# Patient Record
Sex: Male | Born: 1950 | Race: White | Hispanic: No | Smoking: Current every day smoker
Health system: Southern US, Community
[De-identification: ages and names within clinical notes are randomized; demographics above are authoritative.]

## PROBLEM LIST (undated history)

## (undated) DIAGNOSIS — K219 Gastro-esophageal reflux disease without esophagitis: Secondary | ICD-10-CM

## (undated) DIAGNOSIS — K5792 Diverticulitis of intestine, part unspecified, without perforation or abscess without bleeding: Secondary | ICD-10-CM

## (undated) DIAGNOSIS — F419 Anxiety disorder, unspecified: Secondary | ICD-10-CM

## (undated) DIAGNOSIS — F5104 Psychophysiologic insomnia: Secondary | ICD-10-CM

## (undated) DIAGNOSIS — M1990 Unspecified osteoarthritis, unspecified site: Secondary | ICD-10-CM

## (undated) DIAGNOSIS — K759 Inflammatory liver disease, unspecified: Secondary | ICD-10-CM

## (undated) DIAGNOSIS — J449 Chronic obstructive pulmonary disease, unspecified: Secondary | ICD-10-CM

## (undated) DIAGNOSIS — K7460 Unspecified cirrhosis of liver: Secondary | ICD-10-CM

## (undated) DIAGNOSIS — I10 Essential (primary) hypertension: Secondary | ICD-10-CM

## (undated) HISTORY — PX: OTHER SURGICAL HISTORY: SHX169

## (undated) HISTORY — PX: COLON SURGERY: SHX602

## (undated) HISTORY — PX: PARTIAL COLECTOMY: SHX5273

---

## 2015-03-05 DIAGNOSIS — M4306 Spondylolysis, lumbar region: Secondary | ICD-10-CM | POA: Insufficient documentation

## 2015-04-17 DIAGNOSIS — M4726 Other spondylosis with radiculopathy, lumbar region: Secondary | ICD-10-CM | POA: Insufficient documentation

## 2015-06-23 ENCOUNTER — Other Ambulatory Visit (HOSPITAL_COMMUNITY): Payer: Self-pay | Admitting: Nurse Practitioner

## 2015-06-23 DIAGNOSIS — B182 Chronic viral hepatitis C: Secondary | ICD-10-CM

## 2015-07-01 ENCOUNTER — Ambulatory Visit (HOSPITAL_COMMUNITY): Payer: Self-pay

## 2015-07-14 ENCOUNTER — Other Ambulatory Visit: Payer: Self-pay | Admitting: Nurse Practitioner

## 2015-07-14 DIAGNOSIS — K7469 Other cirrhosis of liver: Secondary | ICD-10-CM

## 2015-07-17 ENCOUNTER — Ambulatory Visit (HOSPITAL_COMMUNITY)
Admission: RE | Admit: 2015-07-17 | Discharge: 2015-07-17 | Disposition: A | Discharge status: Home / Self Care | Payer: Medicare HMO | Source: Ambulatory Visit | Attending: Nurse Practitioner | Admitting: Nurse Practitioner

## 2015-07-17 DIAGNOSIS — B182 Chronic viral hepatitis C: Secondary | ICD-10-CM | POA: Diagnosis present

## 2015-07-26 ENCOUNTER — Ambulatory Visit
Admission: RE | Admit: 2015-07-26 | Discharge: 2015-07-26 | Disposition: A | Discharge status: Home / Self Care | Payer: Medicare HMO | Source: Ambulatory Visit | Attending: Nurse Practitioner | Admitting: Nurse Practitioner

## 2015-07-26 DIAGNOSIS — K7469 Other cirrhosis of liver: Secondary | ICD-10-CM

## 2015-07-26 MED ORDER — GADOXETATE DISODIUM 0.25 MMOL/ML IV SOLN
8.0000 mL | Freq: Once | INTRAVENOUS | Status: AC | PRN
  Administered 2015-07-26: 8 mL via INTRAVENOUS

## 2015-11-26 ENCOUNTER — Other Ambulatory Visit: Payer: Self-pay | Admitting: Nurse Practitioner

## 2015-11-26 DIAGNOSIS — K7460 Unspecified cirrhosis of liver: Secondary | ICD-10-CM

## 2015-12-05 ENCOUNTER — Ambulatory Visit
Admission: RE | Admit: 2015-12-05 | Discharge: 2015-12-05 | Disposition: A | Discharge status: Home / Self Care | Payer: Medicare HMO | Source: Ambulatory Visit | Attending: Nurse Practitioner | Admitting: Nurse Practitioner

## 2015-12-05 ENCOUNTER — Other Ambulatory Visit: Payer: Medicare HMO

## 2015-12-05 DIAGNOSIS — K7460 Unspecified cirrhosis of liver: Secondary | ICD-10-CM

## 2015-12-05 MED ORDER — GADOXETATE DISODIUM 0.25 MMOL/ML IV SOLN
9.0000 mL | Freq: Once | INTRAVENOUS | Status: AC | PRN
  Administered 2015-12-05: 9 mL via INTRAVENOUS

## 2016-01-14 ENCOUNTER — Other Ambulatory Visit: Payer: Self-pay | Admitting: Neurological Surgery

## 2016-02-06 NOTE — Pre-Procedure Instructions (Signed)
    Kenneth Obrien  02/06/2016     No Pharmacies Listed   Your procedure is scheduled on Monday, May 1st        [facility] Admitting at 11:00 Am             (Posted surgery time 2:00 - 6:42 pm)   Call this number if you have problems the morning of surgery:  [phone]  (we are NOT open on the weekends)   Remember:  Do not eat food or drink liquids after midnight Sunday.  Take these medicines the morning of surgery with A SIP OF WATER : Xanax   Do not wear jewelry - no rings or watches.  Do not wear lotions or colognes.  You may NOT wear deodorant the day of surgery.              Men may shave face and neck.  Do not bring valuables to the hospital.  [facility] is not responsible for any belongings or valuables.  Contacts, dentures or bridgework may not be worn into surgery.  Leave your suitcase in the car.  After surgery it may be brought to your room. For patients admitted to the hospital, discharge time will be determined by your treatment team.  Name and phone number of your driver:     Please read over the following fact sheets that you were given. Pain Booklet, Coughing and Deep Breathing, Blood Transfusion Information, MRSA Information and Surgical Site Infection Prevention

## 2016-02-09 ENCOUNTER — Encounter (HOSPITAL_COMMUNITY): Payer: Self-pay

## 2016-02-09 ENCOUNTER — Encounter (HOSPITAL_COMMUNITY)
Admission: RE | Admit: 2016-02-09 | Discharge: 2016-02-09 | Disposition: A | Discharge status: Home / Self Care | Payer: Medicare HMO | Source: Ambulatory Visit | Attending: Neurological Surgery | Admitting: Neurological Surgery

## 2016-02-09 DIAGNOSIS — F17200 Nicotine dependence, unspecified, uncomplicated: Secondary | ICD-10-CM | POA: Diagnosis not present

## 2016-02-09 DIAGNOSIS — K219 Gastro-esophageal reflux disease without esophagitis: Secondary | ICD-10-CM | POA: Insufficient documentation

## 2016-02-09 DIAGNOSIS — Z01818 Encounter for other preprocedural examination: Secondary | ICD-10-CM | POA: Diagnosis present

## 2016-02-09 DIAGNOSIS — Z0183 Encounter for blood typing: Secondary | ICD-10-CM | POA: Insufficient documentation

## 2016-02-09 DIAGNOSIS — I10 Essential (primary) hypertension: Secondary | ICD-10-CM | POA: Insufficient documentation

## 2016-02-09 DIAGNOSIS — Z01812 Encounter for preprocedural laboratory examination: Secondary | ICD-10-CM | POA: Insufficient documentation

## 2016-02-09 DIAGNOSIS — Z79899 Other long term (current) drug therapy: Secondary | ICD-10-CM | POA: Diagnosis not present

## 2016-02-09 DIAGNOSIS — M4806 Spinal stenosis, lumbar region: Secondary | ICD-10-CM | POA: Diagnosis not present

## 2016-02-09 DIAGNOSIS — J449 Chronic obstructive pulmonary disease, unspecified: Secondary | ICD-10-CM | POA: Insufficient documentation

## 2016-02-09 DIAGNOSIS — B1920 Unspecified viral hepatitis C without hepatic coma: Secondary | ICD-10-CM | POA: Insufficient documentation

## 2016-02-09 DIAGNOSIS — K7460 Unspecified cirrhosis of liver: Secondary | ICD-10-CM | POA: Insufficient documentation

## 2016-02-09 HISTORY — DX: Gastro-esophageal reflux disease without esophagitis: K21.9

## 2016-02-09 HISTORY — DX: Diverticulitis of intestine, part unspecified, without perforation or abscess without bleeding: K57.92

## 2016-02-09 HISTORY — DX: Anxiety disorder, unspecified: F41.9

## 2016-02-09 HISTORY — DX: Unspecified cirrhosis of liver: K74.60

## 2016-02-09 HISTORY — DX: Unspecified osteoarthritis, unspecified site: M19.90

## 2016-02-09 HISTORY — DX: Inflammatory liver disease, unspecified: K75.9

## 2016-02-09 HISTORY — DX: Psychophysiologic insomnia: F51.04

## 2016-02-09 HISTORY — DX: Chronic obstructive pulmonary disease, unspecified: J44.9

## 2016-02-09 HISTORY — DX: Essential (primary) hypertension: I10

## 2016-02-09 LAB — COMPREHENSIVE METABOLIC PANEL
ALBUMIN: 2.9 g/dL — AB (ref 3.5–5.0)
ALT: 16 U/L — ABNORMAL LOW (ref 17–63)
ANION GAP: 7 (ref 5–15)
AST: 36 U/L (ref 15–41)
Alkaline Phosphatase: 132 U/L — ABNORMAL HIGH (ref 38–126)
BUN: <5 mg/dL — ABNORMAL LOW (ref 6–20)
CO2: 24 mmol/L (ref 22–32)
Calcium: 8.6 mg/dL — ABNORMAL LOW (ref 8.9–10.3)
Chloride: 109 mmol/L (ref 101–111)
Creatinine, Ser: 0.84 mg/dL (ref 0.61–1.24)
GFR calc Af Amer: >60 mL/min (ref >60)
GFR calc non Af Amer: >60 mL/min (ref >60)
GLUCOSE: 88 mg/dL (ref 65–99)
POTASSIUM: 4.3 mmol/L (ref 3.5–5.1)
SODIUM: 140 mmol/L (ref 135–145)
Total Bilirubin: 3.2 mg/dL — ABNORMAL HIGH (ref 0.3–1.2)
Total Protein: 6.9 g/dL (ref 6.5–8.1)

## 2016-02-09 LAB — ABO/RH: ABO/RH(D): B POS

## 2016-02-09 LAB — CBC
HEMATOCRIT: 36.1 % — AB (ref 39.0–52.0)
HEMOGLOBIN: 12.7 g/dL — AB (ref 13.0–17.0)
MCH: 35.5 pg — AB (ref 26.0–34.0)
MCHC: 35.2 g/dL (ref 30.0–36.0)
MCV: 100.8 fL — AB (ref 78.0–100.0)
PLATELETS: 77 10*3/uL — AB (ref 150–400)
RBC: 3.58 MIL/uL — AB (ref 4.22–5.81)
RDW: 14.8 % (ref 11.5–15.5)
WBC: 4.8 10*3/uL (ref 4.0–10.5)

## 2016-02-09 LAB — SURGICAL PCR SCREEN
MRSA, PCR: NEGATIVE
Staphylococcus aureus: NEGATIVE

## 2016-02-09 NOTE — Progress Notes (Signed)
   02/09/16 1141  OBSTRUCTIVE SLEEP APNEA  Have you ever been diagnosed with sleep apnea through a sleep study? No  Do you snore loudly (loud enough to be heard through closed doors)?  0  Do you often feel tired, fatigued, or sleepy during the daytime (such as falling asleep during driving or talking to someone)? 1 (has chronic insomnia)  Has anyone observed you stop breathing during your sleep? 0  Do you have, or are you being treated for high blood pressure? 1  BMI more than 35 kg/m2? 0  Age > 50 (1-yes) 1  Neck circumference greater than:Male 16 inches or larger, Male 17inches or larger? 0  Male Gender (Yes=1) 1  Obstructive Sleep Apnea Score 4  Score 5 or greater  Results sent to PCP

## 2016-02-09 NOTE — Progress Notes (Addendum)
PC[place] [facility]  [phone] Denies any cardiac issues, and has never been to see a cardiologist [facility] Ros[facility]Elsner's office (he's in surgery), regarding Mr. Zachery DauerBarnes abnl labs, esp platelet ct of 77,000.  Had received treatment for Hepatitis C.  Called his PCP for any old CMP & CBC results.  Also called [facility] [phone] for any additional labs he had drawn there during his Hep C treatments.

## 2016-02-10 ENCOUNTER — Encounter (HOSPITAL_COMMUNITY): Payer: Self-pay

## 2016-02-10 NOTE — Progress Notes (Signed)
Anesthesia Chart Review:  Pt is a 65 year old male scheduled for L4-5, L5-S1 PLIF on 02/23/2016 with Dr. Danielle DessElsner.   PCP is Dr. Donnel Sax[place]ran Haque [place]. Hepatologist is Dr. Loura HaltPhillipe Zamor at [facility]; recently finished treatment for hepatitis C, ongoing care for cirrhosis.   PMH includes:  HTN, COPD, hepatitis C, liver cirrhosis, GERD. Current smoker. BMI 28  Medications include: carvedilol  Preoperative labs reviewed.  Platelets 77K. Notified Jessica in Dr. Verlee RossettiElsner's office of lab results. Will get CBC, PT, PTT DOS.   EKG 02/09/16: Sinus bradycardia (51 bpm)  Pt considered to be at high risk for mortality perioperatively due to decompensated cirrhosis per Dr. Keane PoliceZamor's notes (mortality risk at 30 days is 37.1%). Notes also indicate pt is aware of risk and that he is willing to accept any risk if there is a possibility of improving his back pain. (See notes on paper chart).   Reviewed case with Dr. Hart RochesterHollis.   If labs acceptable DOS, I anticipate pt can proceed as scheduled.   Rica Mastngela Haydon Kalmar, FNP-BC [facility] Phone: [phone] 02/10/2016 3:52 PM

## 2016-02-22 MED ORDER — CEFAZOLIN SODIUM-DEXTROSE 2-4 GM/100ML-% IV SOLN
2.0000 g | INTRAVENOUS | Status: AC
  Administered 2016-02-23 (×2): 2 g via INTRAVENOUS
  Filled 2016-02-22: qty 100

## 2016-02-23 ENCOUNTER — Inpatient Hospital Stay (HOSPITAL_COMMUNITY): Payer: Medicare HMO

## 2016-02-23 ENCOUNTER — Inpatient Hospital Stay (HOSPITAL_COMMUNITY)
Admission: RE | Admit: 2016-02-23 | Discharge: 2016-03-01 | DRG: 460 | Disposition: A | Discharge status: Skilled Nursing Facility | Payer: Medicare HMO | Source: Ambulatory Visit | Attending: Neurological Surgery | Admitting: Neurological Surgery

## 2016-02-23 ENCOUNTER — Encounter (HOSPITAL_COMMUNITY): Payer: Self-pay | Admitting: *Deleted

## 2016-02-23 ENCOUNTER — Other Ambulatory Visit: Payer: Self-pay

## 2016-02-23 ENCOUNTER — Inpatient Hospital Stay (HOSPITAL_COMMUNITY): Payer: Medicare HMO | Admitting: Anesthesiology

## 2016-02-23 ENCOUNTER — Encounter (HOSPITAL_COMMUNITY)
Admission: RE | Disposition: A | Discharge status: Skilled Nursing Facility | Payer: Self-pay | Source: Ambulatory Visit | Attending: Neurological Surgery

## 2016-02-23 ENCOUNTER — Inpatient Hospital Stay (HOSPITAL_COMMUNITY): Payer: Medicare HMO | Admitting: Vascular Surgery

## 2016-02-23 DIAGNOSIS — I9581 Postprocedural hypotension: Secondary | ICD-10-CM | POA: Diagnosis not present

## 2016-02-23 DIAGNOSIS — K7460 Unspecified cirrhosis of liver: Secondary | ICD-10-CM | POA: Diagnosis present

## 2016-02-23 DIAGNOSIS — M4726 Other spondylosis with radiculopathy, lumbar region: Secondary | ICD-10-CM | POA: Diagnosis present

## 2016-02-23 DIAGNOSIS — M549 Dorsalgia, unspecified: Secondary | ICD-10-CM

## 2016-02-23 DIAGNOSIS — J449 Chronic obstructive pulmonary disease, unspecified: Secondary | ICD-10-CM | POA: Diagnosis present

## 2016-02-23 DIAGNOSIS — F17210 Nicotine dependence, cigarettes, uncomplicated: Secondary | ICD-10-CM | POA: Diagnosis present

## 2016-02-23 DIAGNOSIS — S3992XD Unspecified injury of lower back, subsequent encounter: Secondary | ICD-10-CM | POA: Diagnosis not present

## 2016-02-23 DIAGNOSIS — D62 Acute posthemorrhagic anemia: Secondary | ICD-10-CM | POA: Diagnosis not present

## 2016-02-23 DIAGNOSIS — K5792 Diverticulitis of intestine, part unspecified, without perforation or abscess without bleeding: Secondary | ICD-10-CM | POA: Diagnosis present

## 2016-02-23 DIAGNOSIS — M4316 Spondylolisthesis, lumbar region: Secondary | ICD-10-CM | POA: Diagnosis present

## 2016-02-23 DIAGNOSIS — M48062 Spinal stenosis, lumbar region with neurogenic claudication: Secondary | ICD-10-CM | POA: Diagnosis present

## 2016-02-23 DIAGNOSIS — F419 Anxiety disorder, unspecified: Secondary | ICD-10-CM | POA: Diagnosis present

## 2016-02-23 DIAGNOSIS — D689 Coagulation defect, unspecified: Secondary | ICD-10-CM | POA: Diagnosis present

## 2016-02-23 DIAGNOSIS — M4806 Spinal stenosis, lumbar region: Secondary | ICD-10-CM | POA: Diagnosis not present

## 2016-02-23 DIAGNOSIS — R739 Hyperglycemia, unspecified: Secondary | ICD-10-CM | POA: Diagnosis present

## 2016-02-23 DIAGNOSIS — D696 Thrombocytopenia, unspecified: Secondary | ICD-10-CM | POA: Insufficient documentation

## 2016-02-23 DIAGNOSIS — E861 Hypovolemia: Secondary | ICD-10-CM | POA: Diagnosis not present

## 2016-02-23 DIAGNOSIS — E872 Acidosis: Secondary | ICD-10-CM | POA: Diagnosis present

## 2016-02-23 DIAGNOSIS — G629 Polyneuropathy, unspecified: Secondary | ICD-10-CM | POA: Insufficient documentation

## 2016-02-23 DIAGNOSIS — S3992XA Unspecified injury of lower back, initial encounter: Secondary | ICD-10-CM

## 2016-02-23 DIAGNOSIS — F5104 Psychophysiologic insomnia: Secondary | ICD-10-CM | POA: Diagnosis present

## 2016-02-23 DIAGNOSIS — N401 Enlarged prostate with lower urinary tract symptoms: Secondary | ICD-10-CM | POA: Diagnosis not present

## 2016-02-23 DIAGNOSIS — K7290 Hepatic failure, unspecified without coma: Secondary | ICD-10-CM | POA: Diagnosis present

## 2016-02-23 DIAGNOSIS — I10 Essential (primary) hypertension: Secondary | ICD-10-CM | POA: Insufficient documentation

## 2016-02-23 DIAGNOSIS — R338 Other retention of urine: Secondary | ICD-10-CM | POA: Diagnosis not present

## 2016-02-23 DIAGNOSIS — K219 Gastro-esophageal reflux disease without esophagitis: Secondary | ICD-10-CM | POA: Diagnosis present

## 2016-02-23 DIAGNOSIS — G4733 Obstructive sleep apnea (adult) (pediatric): Secondary | ICD-10-CM | POA: Diagnosis present

## 2016-02-23 DIAGNOSIS — B182 Chronic viral hepatitis C: Secondary | ICD-10-CM | POA: Diagnosis present

## 2016-02-23 DIAGNOSIS — M79606 Pain in leg, unspecified: Secondary | ICD-10-CM | POA: Diagnosis present

## 2016-02-23 DIAGNOSIS — R0689 Other abnormalities of breathing: Secondary | ICD-10-CM | POA: Insufficient documentation

## 2016-02-23 DIAGNOSIS — M5442 Lumbago with sciatica, left side: Secondary | ICD-10-CM | POA: Diagnosis not present

## 2016-02-23 DIAGNOSIS — D72829 Elevated white blood cell count, unspecified: Secondary | ICD-10-CM | POA: Insufficient documentation

## 2016-02-23 LAB — BASIC METABOLIC PANEL
Anion gap: 6 (ref 5–15)
BUN: 6 mg/dL (ref 6–20)
CHLORIDE: 111 mmol/L (ref 101–111)
CO2: 21 mmol/L — ABNORMAL LOW (ref 22–32)
Calcium: 7.7 mg/dL — ABNORMAL LOW (ref 8.9–10.3)
Creatinine, Ser: 1.03 mg/dL (ref 0.61–1.24)
GFR calc Af Amer: >60 mL/min (ref >60)
GFR calc non Af Amer: >60 mL/min (ref >60)
Glucose, Bld: 156 mg/dL — ABNORMAL HIGH (ref 65–99)
POTASSIUM: 4.7 mmol/L (ref 3.5–5.1)
SODIUM: 138 mmol/L (ref 135–145)

## 2016-02-23 LAB — CBC
HCT: 25.3 % — ABNORMAL LOW (ref 39.0–52.0)
HEMATOCRIT: 31.4 % — AB (ref 39.0–52.0)
HEMATOCRIT: 25.6 % — AB (ref 39.0–52.0)
HEMOGLOBIN: 8.6 g/dL — AB (ref 13.0–17.0)
HEMOGLOBIN: 8.9 g/dL — AB (ref 13.0–17.0)
Hemoglobin: 11 g/dL — ABNORMAL LOW (ref 13.0–17.0)
MCH: 35.3 pg — AB (ref 26.0–34.0)
MCH: 33.6 pg (ref 26.0–34.0)
MCH: 33.3 pg (ref 26.0–34.0)
MCHC: 35 g/dL (ref 30.0–36.0)
MCHC: 34 g/dL (ref 30.0–36.0)
MCHC: 34.8 g/dL (ref 30.0–36.0)
MCV: 100.6 fL — ABNORMAL HIGH (ref 78.0–100.0)
MCV: 98.8 fL (ref 78.0–100.0)
MCV: 95.9 fL (ref 78.0–100.0)
PLATELETS: 60 10*3/uL — AB (ref 150–400)
Platelets: 63 10*3/uL — ABNORMAL LOW (ref 150–400)
Platelets: 63 10*3/uL — ABNORMAL LOW (ref 150–400)
RBC: 3.12 MIL/uL — ABNORMAL LOW (ref 4.22–5.81)
RBC: 2.56 MIL/uL — AB (ref 4.22–5.81)
RBC: 2.67 MIL/uL — AB (ref 4.22–5.81)
RDW: 15 % (ref 11.5–15.5)
RDW: 14.8 % (ref 11.5–15.5)
RDW: 16.4 % — ABNORMAL HIGH (ref 11.5–15.5)
WBC: 4.7 10*3/uL (ref 4.0–10.5)
WBC: 8.8 10*3/uL (ref 4.0–10.5)
WBC: 13.9 10*3/uL — ABNORMAL HIGH (ref 4.0–10.5)

## 2016-02-23 LAB — PROTIME-INR
INR: 1.77 — AB (ref 0.00–1.49)
INR: 2.42 — AB (ref 0.00–1.49)
PROTHROMBIN TIME: 20.6 s — AB (ref 11.6–15.2)
Prothrombin Time: 26 seconds — ABNORMAL HIGH (ref 11.6–15.2)

## 2016-02-23 LAB — APTT
APTT: 50 s — AB (ref 24–37)
aPTT: 42 seconds — ABNORMAL HIGH (ref 24–37)

## 2016-02-23 LAB — TROPONIN I: Troponin I: <0.03 ng/mL (ref <0.031)

## 2016-02-23 SURGERY — POSTERIOR LUMBAR FUSION 2 LEVEL
Anesthesia: General | Site: Back

## 2016-02-23 MED ORDER — ACETAMINOPHEN 650 MG RE SUPP: 650.0000 mg | RECTAL | Status: DC | PRN

## 2016-02-23 MED ORDER — THROMBIN 20000 UNITS EX SOLR
CUTANEOUS | Status: DC | PRN
  Administered 2016-02-23: 20 mL via TOPICAL

## 2016-02-23 MED ORDER — ONDANSETRON HCL 4 MG/2ML IJ SOLN
INTRAMUSCULAR | Status: DC | PRN
  Administered 2016-02-23: 4 mg via INTRAVENOUS

## 2016-02-23 MED ORDER — DEXTROSE 5 % IV SOLN: 500.0000 mg | Freq: Four times a day (QID) | INTRAVENOUS | Status: DC | PRN

## 2016-02-23 MED ORDER — PHENYLEPHRINE HCL 10 MG/ML IJ SOLN
INTRAMUSCULAR | Status: DC | PRN
  Administered 2016-02-23 (×6): 80 ug via INTRAVENOUS

## 2016-02-23 MED ORDER — OXYCODONE-ACETAMINOPHEN 5-325 MG PO TABS: 1.0000 | ORAL_TABLET | ORAL | Status: DC | PRN

## 2016-02-23 MED ORDER — DEXTROSE 5 % IV SOLN
10.0000 mg | INTRAVENOUS | Status: DC | PRN
  Administered 2016-02-23: 20 ug/min via INTRAVENOUS

## 2016-02-23 MED ORDER — METOCLOPRAMIDE HCL 5 MG/ML IJ SOLN: 10.0000 mg | Freq: Once | INTRAMUSCULAR | Status: DC | PRN

## 2016-02-23 MED ORDER — THROMBIN 5000 UNITS EX SOLR
OROMUCOSAL | Status: DC | PRN
  Administered 2016-02-23 (×4): 10 mL via TOPICAL
  Administered 2016-02-23: 10:00:00 via TOPICAL

## 2016-02-23 MED ORDER — EPHEDRINE SULFATE 50 MG/ML IJ SOLN
INTRAMUSCULAR | Status: DC | PRN
  Administered 2016-02-23: 5 mg via INTRAVENOUS

## 2016-02-23 MED ORDER — DIPHENHYDRAMINE HCL 50 MG/ML IJ SOLN
INTRAMUSCULAR | Status: AC
  Filled 2016-02-23: qty 1

## 2016-02-23 MED ORDER — MIDAZOLAM HCL 2 MG/2ML IJ SOLN
INTRAMUSCULAR | Status: AC
  Filled 2016-02-23: qty 2

## 2016-02-23 MED ORDER — CARVEDILOL 3.125 MG PO TABS
ORAL_TABLET | ORAL | Status: AC
  Administered 2016-02-23: 07:00:00
  Filled 2016-02-23: qty 1

## 2016-02-23 MED ORDER — LIDOCAINE 2% (20 MG/ML) 5 ML SYRINGE
INTRAMUSCULAR | Status: AC
  Filled 2016-02-23: qty 5

## 2016-02-23 MED ORDER — MIDAZOLAM HCL 5 MG/5ML IJ SOLN
INTRAMUSCULAR | Status: DC | PRN
  Administered 2016-02-23: 2 mg via INTRAVENOUS

## 2016-02-23 MED ORDER — BUPIVACAINE HCL (PF) 0.5 % IJ SOLN
INTRAMUSCULAR | Status: DC | PRN
  Administered 2016-02-23: 5 mL
  Administered 2016-02-23: 20 mL

## 2016-02-23 MED ORDER — ALUM & MAG HYDROXIDE-SIMETH 200-200-20 MG/5ML PO SUSP
30.0000 mL | Freq: Four times a day (QID) | ORAL | Status: DC | PRN
  Administered 2016-02-25: 30 mL via ORAL
  Filled 2016-02-23: qty 30

## 2016-02-23 MED ORDER — GLYCOPYRROLATE 0.2 MG/ML IJ SOLN
INTRAMUSCULAR | Status: DC | PRN
  Administered 2016-02-23: .6 mg via INTRAVENOUS

## 2016-02-23 MED ORDER — ALBUMIN HUMAN 5 % IV SOLN
INTRAVENOUS | Status: DC | PRN
Start: 2016-02-23 — End: 2016-02-23
  Administered 2016-02-23: 10:00:00 via INTRAVENOUS

## 2016-02-23 MED ORDER — SODIUM CHLORIDE 0.9 % IV SOLN: Freq: Once | INTRAVENOUS | Status: DC

## 2016-02-23 MED ORDER — FENTANYL CITRATE (PF) 100 MCG/2ML IJ SOLN
INTRAMUSCULAR | Status: AC
  Filled 2016-02-23: qty 2

## 2016-02-23 MED ORDER — VECURONIUM BROMIDE 10 MG IV SOLR
INTRAVENOUS | Status: DC | PRN
  Administered 2016-02-23: 4 mg via INTRAVENOUS
  Administered 2016-02-23 (×2): 3 mg via INTRAVENOUS

## 2016-02-23 MED ORDER — DEXAMETHASONE SODIUM PHOSPHATE 10 MG/ML IJ SOLN
INTRAMUSCULAR | Status: AC
  Filled 2016-02-23: qty 1

## 2016-02-23 MED ORDER — SODIUM CHLORIDE 0.9 % IV SOLN
INTRAVENOUS | Status: DC
  Administered 2016-02-23: 16:00:00 via INTRAVENOUS

## 2016-02-23 MED ORDER — METHOCARBAMOL 500 MG PO TABS
500.0000 mg | ORAL_TABLET | Freq: Four times a day (QID) | ORAL | Status: DC | PRN
  Administered 2016-02-23 – 2016-02-29 (×4): 500 mg via ORAL
  Filled 2016-02-23 (×6): qty 1

## 2016-02-23 MED ORDER — LIDOCAINE-EPINEPHRINE 1 %-1:100000 IJ SOLN
INTRAMUSCULAR | Status: DC | PRN
  Administered 2016-02-23: 5 mL

## 2016-02-23 MED ORDER — FENTANYL CITRATE (PF) 100 MCG/2ML IJ SOLN
INTRAMUSCULAR | Status: DC | PRN
  Administered 2016-02-23: 250 ug via INTRAVENOUS
  Administered 2016-02-23: 100 ug via INTRAVENOUS
  Administered 2016-02-23: 50 ug via INTRAVENOUS
  Administered 2016-02-23: 100 ug via INTRAVENOUS
  Administered 2016-02-23: 50 ug via INTRAVENOUS
  Administered 2016-02-23 (×2): 100 ug via INTRAVENOUS

## 2016-02-23 MED ORDER — PROPOFOL 10 MG/ML IV BOLUS
INTRAVENOUS | Status: DC | PRN
  Administered 2016-02-23: 200 mg via INTRAVENOUS

## 2016-02-23 MED ORDER — FENTANYL CITRATE (PF) 250 MCG/5ML IJ SOLN
INTRAMUSCULAR | Status: AC
  Filled 2016-02-23: qty 5

## 2016-02-23 MED ORDER — ROCURONIUM BROMIDE 50 MG/5ML IV SOLN
INTRAVENOUS | Status: AC
  Filled 2016-02-23: qty 1

## 2016-02-23 MED ORDER — LIDOCAINE HCL (CARDIAC) 20 MG/ML IV SOLN
INTRAVENOUS | Status: DC | PRN
  Administered 2016-02-23: 100 mg via INTRAVENOUS

## 2016-02-23 MED ORDER — FENTANYL CITRATE (PF) 100 MCG/2ML IJ SOLN
25.0000 ug | INTRAMUSCULAR | Status: DC | PRN
  Administered 2016-02-23 – 2016-02-24 (×2): 75 ug via INTRAVENOUS
  Filled 2016-02-23 (×2): qty 2

## 2016-02-23 MED ORDER — NEOSTIGMINE METHYLSULFATE 10 MG/10ML IV SOLN
INTRAVENOUS | Status: DC | PRN
  Administered 2016-02-23: 2 mg via INTRAVENOUS

## 2016-02-23 MED ORDER — BACITRACIN 50000 UNITS IM SOLR
INTRAMUSCULAR | Status: DC | PRN
  Administered 2016-02-23: 500 mL

## 2016-02-23 MED ORDER — FENTANYL CITRATE (PF) 100 MCG/2ML IJ SOLN
25.0000 ug | INTRAMUSCULAR | Status: DC | PRN
  Administered 2016-02-23 (×2): 25 ug via INTRAVENOUS

## 2016-02-23 MED ORDER — SODIUM CHLORIDE 0.9 % IJ SOLN
INTRAMUSCULAR | Status: AC
  Filled 2016-02-23: qty 20

## 2016-02-23 MED ORDER — MENTHOL 3 MG MT LOZG
1.0000 | LOZENGE | OROMUCOSAL | Status: DC | PRN
  Administered 2016-02-23: 3 mg via ORAL
  Filled 2016-02-23: qty 9

## 2016-02-23 MED ORDER — DEXAMETHASONE SODIUM PHOSPHATE 10 MG/ML IJ SOLN
INTRAMUSCULAR | Status: DC | PRN
  Administered 2016-02-23: 10 mg via INTRAVENOUS

## 2016-02-23 MED ORDER — ZOLPIDEM TARTRATE 5 MG PO TABS: 10.0000 mg | ORAL_TABLET | Freq: Every day | ORAL | Status: DC

## 2016-02-23 MED ORDER — VITAMIN K1 10 MG/ML IJ SOLN
10.0000 mg | Freq: Once | INTRAVENOUS | Status: AC
  Administered 2016-02-23: 10 mg via INTRAVENOUS
  Filled 2016-02-23: qty 1

## 2016-02-23 MED ORDER — SODIUM CHLORIDE 0.9 % IR SOLN
Status: DC | PRN
  Administered 2016-02-23: 1000 mL

## 2016-02-23 MED ORDER — SODIUM CHLORIDE 0.9 % IV SOLN: 250.0000 mL | INTRAVENOUS | Status: DC

## 2016-02-23 MED ORDER — MORPHINE SULFATE (PF) 2 MG/ML IV SOLN
1.0000 mg | INTRAVENOUS | Status: DC | PRN
  Administered 2016-02-23 (×2): 2 mg via INTRAVENOUS
  Filled 2016-02-23 (×2): qty 1

## 2016-02-23 MED ORDER — CARVEDILOL 3.125 MG PO TABS: 1.5625 mg | ORAL_TABLET | Freq: Two times a day (BID) | ORAL | Status: DC

## 2016-02-23 MED ORDER — ARTIFICIAL TEARS OP OINT
TOPICAL_OINTMENT | OPHTHALMIC | Status: DC | PRN
  Administered 2016-02-23: 1 via OPHTHALMIC

## 2016-02-23 MED ORDER — SODIUM CHLORIDE 0.9 % IV BOLUS (SEPSIS)
1000.0000 mL | Freq: Once | INTRAVENOUS | Status: AC
  Administered 2016-02-23: 1000 mL via INTRAVENOUS

## 2016-02-23 MED ORDER — CARVEDILOL 3.125 MG PO TABS
3.1250 mg | ORAL_TABLET | Freq: Once | ORAL | Status: DC
  Filled 2016-02-23: qty 1

## 2016-02-23 MED ORDER — MEPERIDINE HCL 25 MG/ML IJ SOLN: 6.2500 mg | INTRAMUSCULAR | Status: DC | PRN

## 2016-02-23 MED ORDER — SODIUM CHLORIDE 0.9% FLUSH: 3.0000 mL | INTRAVENOUS | Status: DC | PRN

## 2016-02-23 MED ORDER — FENTANYL CITRATE (PF) 100 MCG/2ML IJ SOLN
25.0000 ug | INTRAMUSCULAR | Status: DC | PRN
  Administered 2016-02-23 (×4): 25 ug via INTRAVENOUS
  Filled 2016-02-23 (×4): qty 2

## 2016-02-23 MED ORDER — LACTATED RINGERS IV SOLN
INTRAVENOUS | Status: DC
Start: 2016-02-23 — End: 2016-02-23

## 2016-02-23 MED ORDER — CEFAZOLIN SODIUM 1-5 GM-% IV SOLN
1.0000 g | Freq: Three times a day (TID) | INTRAVENOUS | Status: AC
  Administered 2016-02-23 – 2016-02-24 (×2): 1 g via INTRAVENOUS
  Filled 2016-02-23 (×3): qty 50

## 2016-02-23 MED ORDER — SODIUM CHLORIDE 0.9% FLUSH
3.0000 mL | Freq: Two times a day (BID) | INTRAVENOUS | Status: DC
  Administered 2016-02-23 – 2016-02-26 (×6): 3 mL via INTRAVENOUS
  Administered 2016-02-26: 6 mL via INTRAVENOUS
  Administered 2016-02-27 – 2016-02-29 (×6): 3 mL via INTRAVENOUS

## 2016-02-23 MED ORDER — POLYETHYLENE GLYCOL 3350 17 G PO PACK: 17.0000 g | PACK | Freq: Every day | ORAL | Status: DC | PRN

## 2016-02-23 MED ORDER — SENNA 8.6 MG PO TABS
1.0000 | ORAL_TABLET | Freq: Two times a day (BID) | ORAL | Status: DC
  Administered 2016-02-24 – 2016-03-01 (×10): 8.6 mg via ORAL
  Filled 2016-02-23 (×12): qty 1

## 2016-02-23 MED ORDER — OXYCODONE HCL 5 MG PO TABS: 20.0000 mg | ORAL_TABLET | Freq: Three times a day (TID) | ORAL | Status: DC | PRN

## 2016-02-23 MED ORDER — DIPHENHYDRAMINE HCL 50 MG/ML IJ SOLN
INTRAMUSCULAR | Status: DC | PRN
  Administered 2016-02-23: 25 mg via INTRAVENOUS

## 2016-02-23 MED ORDER — ALPRAZOLAM 0.5 MG PO TABS
0.5000 mg | ORAL_TABLET | Freq: Every day | ORAL | Status: DC
  Administered 2016-02-23: 0.5 mg via ORAL
  Filled 2016-02-23: qty 1

## 2016-02-23 MED ORDER — MAGNESIUM CITRATE PO SOLN
1.0000 | Freq: Once | ORAL | Status: AC | PRN
  Administered 2016-02-28: 1 via ORAL
  Filled 2016-02-23: qty 296

## 2016-02-23 MED ORDER — ROCURONIUM BROMIDE 100 MG/10ML IV SOLN
INTRAVENOUS | Status: DC | PRN
  Administered 2016-02-23: 50 mg via INTRAVENOUS

## 2016-02-23 MED ORDER — ONDANSETRON HCL 4 MG/2ML IJ SOLN
INTRAMUSCULAR | Status: AC
  Filled 2016-02-23: qty 2

## 2016-02-23 MED ORDER — ONDANSETRON HCL 4 MG/2ML IJ SOLN: 4.0000 mg | INTRAMUSCULAR | Status: DC | PRN

## 2016-02-23 MED ORDER — LACTATED RINGERS IV SOLN
INTRAVENOUS | Status: DC | PRN
  Administered 2016-02-23 (×3): via INTRAVENOUS

## 2016-02-23 MED ORDER — DOCUSATE SODIUM 100 MG PO CAPS
100.0000 mg | ORAL_CAPSULE | Freq: Two times a day (BID) | ORAL | Status: DC
  Administered 2016-02-25 – 2016-03-01 (×10): 100 mg via ORAL
  Filled 2016-02-23 (×12): qty 1

## 2016-02-23 MED ORDER — PHENOL 1.4 % MT LIQD: 1.0000 | OROMUCOSAL | Status: DC | PRN

## 2016-02-23 MED ORDER — SODIUM CHLORIDE 0.9 % IV BOLUS (SEPSIS)
500.0000 mL | Freq: Once | INTRAVENOUS | Status: AC
  Administered 2016-02-23: 500 mL via INTRAVENOUS

## 2016-02-23 MED ORDER — ACETAMINOPHEN 325 MG PO TABS
650.0000 mg | ORAL_TABLET | ORAL | Status: DC | PRN
  Administered 2016-02-27 (×2): 650 mg via ORAL
  Filled 2016-02-23 (×2): qty 2

## 2016-02-23 MED ORDER — LACTATED RINGERS IV SOLN
INTRAVENOUS | Status: DC
  Administered 2016-02-23: 20:00:00 via INTRAVENOUS

## 2016-02-23 MED ORDER — BISACODYL 10 MG RE SUPP: 10.0000 mg | Freq: Every day | RECTAL | Status: DC | PRN

## 2016-02-23 MED ORDER — VECURONIUM BROMIDE 10 MG IV SOLR
INTRAVENOUS | Status: AC
  Filled 2016-02-23: qty 10

## 2016-02-23 SURGICAL SUPPLY — 75 items
BAG DECANTER FOR FLEXI CONT (MISCELLANEOUS) ×3 IMPLANT
BLADE CLIPPER SURG (BLADE) IMPLANT
BONE CANC CHIPS 20CC PCAN1/4 (Bone Implant) ×3 IMPLANT
BUR MATCHSTICK NEURO 3.0 LAGG (BURR) ×3 IMPLANT
CAGE COROENT LRG 9X9X28-8 (Cage) ×6 IMPLANT
CAGE PLIF MAS 9X8X28-4 LUMBAR (Cage) ×6 IMPLANT
CANISTER SUCT 3000ML PPV (MISCELLANEOUS) ×3 IMPLANT
CHIPS CANC BONE 20CC PCAN1/4 (Bone Implant) ×1 IMPLANT
CONT SPEC 4OZ CLIKSEAL STRL BL (MISCELLANEOUS) ×3 IMPLANT
COVER BACK TABLE 60X90IN (DRAPES) ×3 IMPLANT
DECANTER SPIKE VIAL GLASS SM (MISCELLANEOUS) ×3 IMPLANT
DERMABOND ADHESIVE PROPEN (GAUZE/BANDAGES/DRESSINGS) ×2
DERMABOND ADVANCED (GAUZE/BANDAGES/DRESSINGS) ×2
DERMABOND ADVANCED .7 DNX12 (GAUZE/BANDAGES/DRESSINGS) ×1 IMPLANT
DERMABOND ADVANCED .7 DNX6 (GAUZE/BANDAGES/DRESSINGS) ×1 IMPLANT
DEVICE DISSECT PLASMABLAD 3.0S (MISCELLANEOUS) ×1 IMPLANT
DRAPE C-ARM 42X72 X-RAY (DRAPES) ×6 IMPLANT
DRAPE LAPAROTOMY 100X72X124 (DRAPES) ×3 IMPLANT
DRAPE POUCH INSTRU U-SHP 10X18 (DRAPES) ×3 IMPLANT
DRAPE PROXIMA HALF (DRAPES) IMPLANT
DRSG OPSITE 4X5.5 SM (GAUZE/BANDAGES/DRESSINGS) ×3 IMPLANT
DRSG OPSITE POSTOP 4X8 (GAUZE/BANDAGES/DRESSINGS) ×3 IMPLANT
DURAPREP 26ML APPLICATOR (WOUND CARE) ×3 IMPLANT
ELECT REM PT RETURN 9FT ADLT (ELECTROSURGICAL) ×6
ELECTRODE REM PT RTRN 9FT ADLT (ELECTROSURGICAL) ×2 IMPLANT
GAUZE SPONGE 4X4 12PLY STRL (GAUZE/BANDAGES/DRESSINGS) ×3 IMPLANT
GAUZE SPONGE 4X4 16PLY XRAY LF (GAUZE/BANDAGES/DRESSINGS) ×3 IMPLANT
GLOVE BIOGEL PI IND STRL 7.5 (GLOVE) ×1 IMPLANT
GLOVE BIOGEL PI IND STRL 8.5 (GLOVE) ×2 IMPLANT
GLOVE BIOGEL PI INDICATOR 7.5 (GLOVE) ×2
GLOVE BIOGEL PI INDICATOR 8.5 (GLOVE) ×4
GLOVE ECLIPSE 8.5 STRL (GLOVE) ×6 IMPLANT
GLOVE EXAM NITRILE LRG STRL (GLOVE) IMPLANT
GLOVE EXAM NITRILE MD LF STRL (GLOVE) IMPLANT
GLOVE EXAM NITRILE XL STR (GLOVE) IMPLANT
GLOVE EXAM NITRILE XS STR PU (GLOVE) IMPLANT
GLOVE SS BIOGEL STRL SZ 7 (GLOVE) ×1 IMPLANT
GLOVE SUPERSENSE BIOGEL SZ 7 (GLOVE) ×2
GOWN STRL REUS W/ TWL LRG LVL3 (GOWN DISPOSABLE) ×1 IMPLANT
GOWN STRL REUS W/ TWL XL LVL3 (GOWN DISPOSABLE) ×2 IMPLANT
GOWN STRL REUS W/TWL 2XL LVL3 (GOWN DISPOSABLE) ×6 IMPLANT
GOWN STRL REUS W/TWL LRG LVL3 (GOWN DISPOSABLE) ×2
GOWN STRL REUS W/TWL XL LVL3 (GOWN DISPOSABLE) ×4
HEMOSTAT POWDER KIT SURGIFOAM (HEMOSTASIS) ×15 IMPLANT
KIT BASIN OR (CUSTOM PROCEDURE TRAY) ×3 IMPLANT
KIT INFUSE MEDIUM (Orthopedic Implant) ×3 IMPLANT
KIT ROOM TURNOVER OR (KITS) ×3 IMPLANT
MILL MEDIUM DISP (BLADE) ×3 IMPLANT
MODULE POWER NUVASIVE (MISCELLANEOUS) ×1 IMPLANT
NEEDLE HYPO 22GX1.5 SAFETY (NEEDLE) ×3 IMPLANT
NEEDLE SPNL 18GX3.5 QUINCKE PK (NEEDLE) ×3 IMPLANT
NS IRRIG 1000ML POUR BTL (IV SOLUTION) ×3 IMPLANT
PACK LAMINECTOMY NEURO (CUSTOM PROCEDURE TRAY) ×3 IMPLANT
PAD ARMBOARD 7.5X6 YLW CONV (MISCELLANEOUS) ×9 IMPLANT
PATTIES SURGICAL .5 X1 (DISPOSABLE) ×9 IMPLANT
PATTIES SURGICAL 1X1 (DISPOSABLE) ×6 IMPLANT
PLASMABLADE 3.0S (MISCELLANEOUS) ×3
POWER MODULE NUVASIVE (MISCELLANEOUS) ×3
ROD RELINE-O LORDOTIC 5.5X60MM (Rod) ×6 IMPLANT
SCREW LOCK RELINE 5.5 TULIP (Screw) ×18 IMPLANT
SCREW RELINE-O POLY 6.5X45 (Screw) ×12 IMPLANT
SCREW RELINE-O POLY 6.5X50MM (Screw) ×6 IMPLANT
SPONGE LAP 4X18 X RAY DECT (DISPOSABLE) ×3 IMPLANT
SPONGE SURGIFOAM ABS GEL 100 (HEMOSTASIS) ×3 IMPLANT
SUT VIC AB 1 CT1 18XBRD ANBCTR (SUTURE) ×2 IMPLANT
SUT VIC AB 1 CT1 8-18 (SUTURE) ×4
SUT VIC AB 2-0 CP2 18 (SUTURE) ×6 IMPLANT
SUT VIC AB 3-0 SH 8-18 (SUTURE) ×6 IMPLANT
SYR 3ML LL SCALE MARK (SYRINGE) ×12 IMPLANT
SYR 5ML LL (SYRINGE) IMPLANT
TOWEL OR 17X24 6PK STRL BLUE (TOWEL DISPOSABLE) ×3 IMPLANT
TOWEL OR 17X26 10 PK STRL BLUE (TOWEL DISPOSABLE) ×3 IMPLANT
TRAP SPECIMEN MUCOUS 40CC (MISCELLANEOUS) ×3 IMPLANT
TRAY FOLEY W/METER SILVER 14FR (SET/KITS/TRAYS/PACK) ×3 IMPLANT
WATER STERILE IRR 1000ML POUR (IV SOLUTION) ×3 IMPLANT

## 2016-02-23 NOTE — Op Note (Signed)
Date of surgery: 02/23/2016 Preoperative diagnosis: Neurogenic claudication with lumbar spinal stenosis, lumbar radiculopathy  Postoperative diagnosis: Neurogenic claudication with lumbar spinal stenosis, lumbar radiculopathy Procedure: Decompression L4-5 and L5-S1 with laminectomy and decompression the L4 the L5 and the S1 nerve roots more work than required for simple interbody technique. Posterior lumbar interbody arthrodesis with peek spacers local autograft and allograft L4-5 and L5-S1, posterior lateral arthrodesis L4 to the sacrum with local autograft and allograft, pedicle screw fixation L4 to the sacrum.  Surgeon: Barnett Abu First assistant: Cherrie Distance M.D. Anesthesia: Gen. endotracheal Indications: Kenneth Obrien is a 65 year old individual who's had significant back and bilateral lower external knee pain with weakness in the tibialis anterior groups and gastrocs bilaterally. His found to have severe spinal stenosis at L4-5 and L5-S1 with formation of an early degenerative scoliosis in the upper lumbar spine. He was advised regarding surgery to decompress and stabilize this process however because the patient was recovering from severe hepatitis C his surgery was put offfor a period of time.  Procedure: The patient was brought to the operating room supine on a stretcher. After the smooth induction of general endotracheal anesthesia, he was turned prone. The back was prepped with alcohol DuraPrep and draped in a sterile fashion. Midline incision was used in the lower lumbar spine and a subperiosteal dissection was undertaken at L4 and L5 which were ultimately removed identified positively with radiograph. Hemostasis was maintained as well along however the patient was collected pathic with an INR 1.7. We ultimately uncovered the L4-5 facet joint and remove the inferior portion of the facet attached to the L4 lamina. A complete laminectomy was then performed at L5 and the thickened redundant  yellow ligament and these region was taken up which relieved a good portion of the stenosis. The pads of the L4 nerve root superiorly the L5 nerve root inferiorly and the S1 nerve root inferior to that were then sequentially decompressed using a 2 and 3 mm Kerrison punch with care being taken to protect the nerve roots. Hemostasis was obtained meticulously using a bipolar cautery and small pledgets of Gelfoam soaked in thrombin along with surgery phone. Once the decompression was performed on one side the outside was treated same way. The disc spaces were then isolated and then at L4-5 and L5-S1 complete discectomies were performed using a combination of curettes and rongeurs from each side to remove vast quantities of degenerated disc material from within the disc space at L5-S1 there was noted to be a substantial centrally herniated portion of the disc which was removed along with the posterior longitudinal ligament this engendered a fair amount of epidural bleeding which was controlled ultimately by packing with surgery cell and cottonoids. The disc space was then decorticated with each of the endplates being prepared for grafting. The interspace was then distracted so as to open the lateral recesses even further particularly at L5-S1 where there was severe stenosis. Ultimately was felt that 10 mm tall 28 mm long 12 lordotic spacer would fit best into the interspaces at L5-S1 after sizing and checking radiographically. Spacers were prepared and packed with autograft in addition to strips of infuse then a total of 9 mL of autograft was packed into the interspace along with some small strips of infuse and the spacers were placed under radiographic confirmation. At L4-L5 after appropriate sizing is felt that the same 10 mm tall 28 mm long 12 lordotic spacers would fit best and help restore lordosis. These were then packed full  with 6 mL of bone graft into the interspace along with some strips of infuse. The  lateral gutters were then decorticated and packed with a total of 6 mL of autograft and allograft along with strips of infuse between the transverse processes of L4 and the sacral alar.  Next pedicle screws were placed at L4-L5 and S1 each being sounded individually and using fluoroscopic guidance to place them bilaterally and laterally. 6.5 x 45 mm screws were placed at L4 and L5 and 6.5 x 50 mm screws were placed in the sacral with bicortical purchase.  60 mm precontoured rods were then used to connect the screw heads together from L4 to sacrum in a neutral construct. Final radiographic confirmation of the construct was obtained in AP and lateral projections. Care was taken then to make sure the common dural tube the L4 the L5 and S1 nerve roots were well decompressed bilaterally at epidural bleeding was well controlled prior to closure over large Hemovac drain. Lumbar dorsal fascia was closed with #1 Vicryl in interrupted fashion 2-0 Vicryl was used in the subcutaneous anus tissues, 3-0 Vicryl subcuticularly. Dermabond was placed on the skin. Blood loss for the procedure was estimated at 1500 mL and 600 mL of Cell Saver blood was returned to the patient.

## 2016-02-23 NOTE — Progress Notes (Signed)
eLink Physician-Brief Progress Note Patient Name: Kenneth Obrien DOB: Apr 06, 1951 MRN: [id number]   Date of Service  02/23/2016  HPI/Events of Note   Pain - Not relieved with Fentanyl 25 mcg Q 1 hour PRN.   eICU Interventions  Will order: 1. Increase Fentanyl PRN to 25-75 mcg IV Q 1 hour PRN.  2. Bolus with 0.9 NaCl 1 liter IV over 1 hour now.      Intervention Category Intermediate Interventions: Abdominal pain - evaluation and management;Pain - evaluation and management  Sommer,Steven Eugene 02/23/2016, 10:03 PM

## 2016-02-23 NOTE — Anesthesia Postprocedure Evaluation (Signed)
Anesthesia Post Note  Patient: Kenneth MarketStephen C Johns  Procedure(s) Performed: Procedure(s) (LRB): L4-5 L5-S1 Posterior lumbar interbody fusion (N/A)  Patient location during evaluation: PACU Anesthesia Type: General Level of consciousness: awake and alert Pain management: pain level controlled Vital Signs Assessment: post-procedure vital signs reviewed and stable Respiratory status: spontaneous breathing, nonlabored ventilation, respiratory function stable and patient connected to nasal cannula oxygen Cardiovascular status: blood pressure returned to baseline and stable Postop Assessment: no signs of nausea or vomiting Anesthetic complications: no    Last Vitals:  Filed Vitals:   02/23/16 1408 02/23/16 1412  BP:    Pulse:  83  Temp: 36.4 C   Resp: 22 20    Last Pain:  Filed Vitals:   02/23/16 1414  PainSc: 0-No pain                 Phillips Groutarignan, Chrles Selley

## 2016-02-23 NOTE — Consult Note (Signed)
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 He presented to [facility] for elective spine surgery under Dr. Danielle Dess after suffering significant back and lower extremity pain for several months. He was found to have spondylolisthesis with stenosis at L4-5 and L 5-S1 causing severe neuropathy and radiculopathy. He presented 5/1 for L4-5 L5-S1 Posterior lumbar interbody fusion. Surgery was without complication with the exception of about estimated blood loss and returned via cell-saver. INR mildly elevated pre-operatively and platelet count 60. He was transfused platelets prior to surgery. Post-operatively Kenneth Obrien was having some hypotension in ICU and PCCM was consulted for further evaluation.   PAST MEDICAL HISTORY :  He  has a past medical history of Hypertension; Chronic insomnia; COPD (chronic obstructive pulmonary disease) (HCC); Anxiety; GERD (gastroesophageal reflux disease); Arthritis; Hepatitis; Diverticulitis; and Cirrhosis of liver (HCC).  PAST SURGICAL HISTORY: He  has past surgical history that includes Partial colectomy; Colon surgery; and broken left ankle.  Not on File  No current facility-administered medications on file prior to encounter.   No current outpatient prescriptions on file prior to encounter.    FAMILY HISTORY:  His has no family status information on file.   SOCIAL HISTORY: He  reports that he has been smoking Cigarettes.  He has a 20 pack-year smoking history. He does not have any smokeless tobacco history on file. He reports that he does not drink alcohol or use illicit drugs.  REVIEW OF SYSTEMS:    Bolds are positive  Constitutional: weight loss, gain, night sweats, Fevers, chills, fatigue .  HEENT: headaches, Sore throat, sneezing, nasal congestion, post nasal drip, Difficulty swallowing, Tooth/dental problems, visual complaints visual changes, ear ache CV:  chest pain, radiates: ,Orthopnea, PND, swelling in lower extremities, dizziness, palpitations, syncope.  GI  heartburn, indigestion, abdominal pain, nausea, vomiting, diarrhea, change in bowel habits, loss of appetite, bloody stools.  Resp: cough, productive: , hemoptysis, dyspnea, chest pain, pleuritic.  Skin: rash or itching or icterus GU: dysuria, change in color of urine, urgency or frequency. flank pain, hematuria  QM:VHQI pain or swelling. decreased range of motion  Psych: change in mood or affect. depression or anxiety.  Neuro: difficulty with speech, weakness, numbness, ataxia    SUBJECTIVE:   VITAL SIGNS: BP 94/72 mmHg  Pulse 75  Temp(Src) 97 F (36.1 C) (Oral)  Resp 18  Ht  (1.803 m)  Wt 89.812 kg (198 lb)  BMI 27.63 kg/m2  SpO2 100%  HEMODYNAMICS:    VENTILATOR SETTINGS:    INTAKE / OUTPUT:    PHYSICAL EXAMINATION: General:  Thin male mildly agitated due to pain Neuro:  Alert, oriented, non-focal. Movement and sensation intact BLE HEENT:  [place]/AT, PERRL, no appreciable JVD Cardiovascular:  RRR, no MRG Lungs:  Clear bilateral breath sounds Abdomen:  Soft, non-tender, nondistended Musculoskeletal:  No acute deformity, back pain as described above Skin:  Grossly intact with exception of surgical wound, vac in place.  LABS:  BMET  Recent Labs Lab 02/23/16 1643  NA 138  K 4.7  CL 111  CO2 21*  BUN 6  CREATININE 1.03  GLUCOSE 156*    Electrolytes  Recent  Labs Lab 02/23/16 1643  CALCIUM 7.7*    CBC  Recent Labs Lab 02/23/16 0632 02/23/16 1643  WBC 4.7 8.8  HGB 11.0* 8.6*  HCT 31.4* 25.3*  PLT 60* 63*    Coag's  Recent Labs Lab 02/23/16 0632  APTT 42*  INR  1.77*    Sepsis Markers No results for input(s): LATICACIDVEN, PROCALCITON, O2SATVEN in the last 168 hours.  ABG No results for input(s): PHART, PCO2ART, PO2ART in the last 168 hours.  Liver Enzymes No results for input(s): AST, ALT, ALKPHOS, BILITOT, ALBUMIN in the last 168 hours.  Cardiac Enzymes No results for input(s): TROPONINI, PROBNP in the last 168 hours.  Glucose No results for input(s): GLUCAP in the last 168 hours.  Imaging Dg Lumbar Spine 2-3 Views  02/23/2016  CLINICAL DATA:  Operative imaging from lumbar spine fusion. EXAM: LUMBAR SPINE - 2-3 VIEW; DG C-ARM 61-120 MIN COMPARISON:  None. FINDINGS: Pedicle screws and interconnecting rods fuse L4, L5 and S1. Pedicle screws are well positioned and well seated. There are radiolucent disc spacers well centered maintaining disc height the L4-L5 and L5-S1 levels. IMPRESSION: Operative imaging from L4 through S1 posterior lumbar spine fusion. Electronically Signed   By: Amie Portlandavid  Ormond M.D.   On: 02/23/2016 15:31   Dg Lumbar Spine 2-3 Views  02/23/2016  CLINICAL DATA:  L4-5 and L5-S1 PLIF EXAM: LUMBAR SPINE - 2-3 VIEW COMPARISON:  01/17/2016 FINDINGS: Image 1 shows 2 surgical device is 1 projecting above and 1 below the L4 spinous process. Second image shows 2 localization probes, 1 projecting at the L4-5 disc space level and the other at the L5-S1 disc space level. IMPRESSION: Intraoperative localization Electronically Signed   By: Esperanza Heiraymond  Rubner M.D.   On: 02/23/2016 13:37   Dg C-arm 61-120 Min  02/23/2016  CLINICAL DATA:  Operative imaging from lumbar spine fusion. EXAM: LUMBAR SPINE - 2-3 VIEW; DG C-ARM 61-120 MIN COMPARISON:  None. FINDINGS: Pedicle screws and interconnecting rods fuse L4, L5 and S1. Pedicle screws are well positioned and well seated. There are radiolucent disc spacers well centered maintaining disc height the L4-L5 and L5-S1 levels. IMPRESSION: Operative imaging from L4 through S1 posterior lumbar spine fusion.  Electronically Signed   By: Amie Portlandavid  Ormond M.D.   On: 02/23/2016 15:31     STUDIES:    CULTURES:   ANTIBIOTICS: Periop ancef  SIGNIFICANT EVENTS: 5/1 > L4-5 L5-S1 Posterior lumbar interbody fusion, post op hypotnension  LINES/TUBES:   DISCUSSION: 65 year old male with hepatic cirrhosis secondary to Hepatitis C post treamtnet. Prenetend 5/1 for elective spine surgery under Dr. Danielle DessElsner. Had some blood loss during procedure (1500mL, with 750 returned via cellsaver). He was hypotensive post-operatively so PCCM consulted. Hypotenion likely due to hypovolemia/anemia in post op setting. Will give additional bolus, and then pRBC if BP still soft. Trend blood counts and coags.   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  Transfuse for Hgb < 8, (will give one unit pRBC now) Transfuse platelets for count < 50 Repeat PT/INR Type and screen on file Vitamin K 10mg  now SCDs, VTE chemoprophylaxis when OK by primary  INFECTIOUS A:   Surgical ppx  P:   Ancef per primary  ENDOCRINE A:   Hyperglycemia without history of DM  P:   Follow glucose on BMET in AM  NEUROLOGIC A:   Spondylolisthesis L4-5 L5-S1 with stenosis lumbar radiculopathy status post  surgical decompression and fixation 5/1  P:   Management per neurosurgery Hold xanax, ambien while hypotensive   FAMILY  - Updates: patient updated by DF 5/1  - Inter-disciplinary family meet or Palliative Care meeting due by:  5/8   Joneen Roach, AGACNP-BC [place] [facility] Pager [phone] or [phone]  02/23/2016 5:56 PM   STAFF NOTE: Cindi Carbon, MD FACP have personally reviewed patient's available data, including medical history, events of note, physical examination and test results as part of my evaluation. I have discussed with resident/NP and other care providers such as pharmacist, RN and RRT. In addition, I personally evaluated patient and elicited key findings of: Awake, alert, moderate to severe pain, sys pressure improving, no active blood from wound, moves all ext equally well, sensation intact, noted blood loss in setting liver dz, cell saver back about half of losses, concern is hemorraghic shock and liver induced coagulapthy in addition to dilution and consumption peri op, stat cbc, vit K x 1 now IV, repeat coags in am , may need ffp if bleeding from wound noted, assess trop, ecg, would transfuse above hgb 7 if concern is active bleeding, would follw lft in am for shock, cystalloid is okay for now, but if resus needed would prefer products, will follow  Mcarthur Rossetti. Tyson Alias, MD, FACP Pgr: [phone] [facility] 02/23/2016 9:50 PM

## 2016-02-23 NOTE — Transfer of Care (Signed)
Immediate Anesthesia Transfer of Care Note  Patient: Kenneth Obrien  Procedure(s) Performed: Procedure(s) with comments: L4-5 L5-S1 Posterior lumbar interbody fusion (N/A) - L4-5 L5-S1 Posterior lumbar interbody fusion  Patient Location: PACU  Anesthesia Type:General  Level of Consciousness: awake, alert , oriented and patient cooperative  Airway & Oxygen Therapy: Patient Spontanous Breathing and Patient connected to nasal cannula oxygen  Post-op Assessment: Report given to RN and Post -op Vital signs reviewed and stable  Post vital signs: Reviewed and stable  Last Vitals:  Filed Vitals:   02/23/16 0639  BP: 138/59  Pulse: 69  Temp: 36.7 C  Resp: 20    Last Pain: There were no vitals filed for this visit.       Complications: No apparent anesthesia complications

## 2016-02-23 NOTE — H&P (Signed)
Kenneth Obrien is an 65 y.o. male.   Chief Complaint: Back and bilateral lower extremity pain HPI: Patient is 65 year old individual who's had significant back and bilateral lower extremity pain who had also has significant liver disease. He has had hepatitis C treated successfully but has had the ravages of significant liver damage. Nonetheless he's had spondylolisthesis with stenosis at L4-5 and L5-S1 but cause severe peripheral neuropathy in addition to radiculopathy that he still suffers with. He's been advised regarding surgical decompression arthrodesis and though he is a high risk candidate and he understands the potential for complications from this procedure and wishes to proceed.  Past Medical History  Diagnosis Date  . Hypertension   . Chronic insomnia   . COPD (chronic obstructive pulmonary disease) (HCC)   . Anxiety   . GERD (gastroesophageal reflux disease)     takes OTC  . Arthritis   . Hepatitis     had hep c   took tx ...last one in Dec. 2016  . Diverticulitis   . Cirrhosis of liver [facility])     Past Surgical History  Procedure Laterality Date  . Partial colectomy      took 19 inches in 1998 for diverticulitis  . Colon surgery    . Broken left ankle      no surgery done    History reviewed. No pertinent family history. Social History:  reports that he has been smoking Cigarettes.  He has a 20 pack-year smoking history. He does not have any smokeless tobacco history on file. He reports that he does not drink alcohol or use illicit drugs.  Allergies: Not on File  Medications Prior to Admission  Medication Sig Dispense Refill  . ALPRAZolam (XANAX) 0.5 MG tablet Take 0.5 mg by mouth daily.    . carvedilol (COREG) 3.125 MG tablet Take 1.5625 mg by mouth 2 (two) times daily with a meal.    . Diphenhydramine-APAP, sleep, (PAIN RELIEVER/SLEEP AID PO) Take 0.5 tablets by mouth at bedtime.    . Oxycodone HCl 20 MG TABS Take 20 mg by mouth 3 (three) times daily as needed  (for pain).     Marland Kitchen zolpidem (AMBIEN) 10 MG tablet Take 10 mg by mouth at bedtime.      Results for orders placed or performed during the hospital encounter of 02/23/16 (from the past 48 hour(s))  APTT     Status: Abnormal   Collection Time: 02/23/16  6:32 AM  Result Value Ref Range   aPTT 42 (H) 24 - 37 seconds    Comment:        IF BASELINE aPTT IS ELEVATED, SUGGEST PATIENT RISK ASSESSMENT BE USED TO DETERMINE APPROPRIATE ANTICOAGULANT THERAPY.   Protime-INR     Status: Abnormal   Collection Time: 02/23/16  6:32 AM  Result Value Ref Range   Prothrombin Time 20.6 (H) 11.6 - 15.2 seconds   INR 1.77 (H) 0.00 - 1.49   No results found.  Review of Systems  HENT: Negative.   Eyes: Negative.   Respiratory: Negative.   Cardiovascular: Negative.   Gastrointestinal:       History of end-stage liver disease with hepatitis see that has been treated successfully  Musculoskeletal: Positive for back pain.  Skin: Negative.   Neurological: Positive for dizziness, tingling, sensory change, focal weakness and weakness.  Psychiatric/Behavioral: Positive for depression.    Blood pressure 138/59, pulse 69, temperature 98 F (36.7 C), temperature source Oral, resp. rate 20, height  (1.803 m), weight  89.812 kg (198 lb), SpO2 98 %. Physical Exam  Constitutional: He is oriented to person, place, and time. He appears well-developed and well-nourished.  HENT:  Head: Normocephalic and atraumatic.  Eyes: Conjunctivae and EOM are normal. Pupils are equal, round, and reactive to light.  Neck: Normal range of motion. Neck supple.  Cardiovascular: Normal rate and regular rhythm.   Respiratory: Effort normal and breath sounds normal.  GI: Soft. Bowel sounds are normal.  Musculoskeletal:  Centralized low back pain positive straight leg raising at 15 in either lower extremity Patrick's maneuver is negative  Neurological: He is alert and oriented to person, place, and time.  Absent deep tendon  reflexes marked weakness in both tibialis anterior and gastrocs at 4 minus out of 5. Significant muscle wasting in both distal lower extremities  Skin: Skin is warm and dry.  Psychiatric: He has a normal mood and affect. His behavior is normal. Judgment and thought content normal.     Assessment/Plan Spondylolisthesis L4-5 L5-S1 with stenosis lumbar radiculopathy.  Surgical decompression and fixation and fusion L4 to sacrum  Stefani DamaELSNER,Icelynn Onken J, MD 02/23/2016, 7:14 AM

## 2016-02-23 NOTE — Anesthesia Procedure Notes (Signed)
Procedure Name: Intubation Date/Time: 02/23/2016 7:37 AM Performed by: Marena ChancyBECKNER, Jaylei Fuerte S Pre-anesthesia Checklist: Emergency Drugs available, Timeout performed, Suction available, Patient identified and Patient being monitored Patient Re-evaluated:Patient Re-evaluated prior to inductionOxygen Delivery Method: Circle system utilized Preoxygenation: Pre-oxygenation with 100% oxygen Intubation Type: IV induction Laryngoscope Size: Miller and 2 Grade View: Grade I Tube type: Oral Tube size: 7.5 mm Number of attempts: 1 Placement Confirmation: ETT inserted through vocal cords under direct vision,  breath sounds checked- equal and bilateral and positive ETCO2 Tube secured with: Tape Dental Injury: Teeth and Oropharynx as per pre-operative assessment

## 2016-02-23 NOTE — Progress Notes (Addendum)
BP soft, 90/55. Drain emptied, 160 cc bloody output since admission to ICU. Dr. Danielle DessElsner notified. Order received for CBC,  BMET, and NS bolus 500 cc now. MD to consult CCM. Goal SBP >90. Will continue to monitor.

## 2016-02-23 NOTE — Anesthesia Preprocedure Evaluation (Addendum)
Anesthesia Evaluation  Patient identified by MRN, date of birth, ID band Patient awake    Reviewed: Allergy & Precautions, NPO status , Patient's Chart, lab work & pertinent test results  Airway Mallampati: II  TM Distance: >3 FB Neck ROM: Full    Dental no notable dental hx.    Pulmonary COPD, Current Smoker,    Pulmonary exam normal breath sounds clear to auscultation       Cardiovascular hypertension, Pt. on medications Normal cardiovascular exam Rhythm:Regular Rate:Normal     Neuro/Psych negative neurological ROS  negative psych ROS   GI/Hepatic negative GI ROS, (+) Cirrhosis       , Hepatitis -, C  Endo/Other  negative endocrine ROS  Renal/GU negative Renal ROS  negative genitourinary   Musculoskeletal negative musculoskeletal ROS (+)   Abdominal   Peds negative pediatric ROS (+)  Hematology negative hematology ROS (+)   Anesthesia Other Findings   Reproductive/Obstetrics negative OB ROS                            Anesthesia Physical Anesthesia Plan  ASA: III  Anesthesia Plan: General   Post-op Pain Management:    Induction: Intravenous  Airway Management Planned: Oral ETT  Additional Equipment:   Intra-op Plan:   Post-operative Plan: Extubation in OR  Informed Consent: I have reviewed the patients History and Physical, chart, labs and discussed the procedure including the risks, benefits and alternatives for the proposed anesthesia with the patient or authorized representative who has indicated his/her understanding and acceptance.   Dental advisory given  Plan Discussed with: CRNA  Anesthesia Plan Comments:         Anesthesia Quick Evaluation

## 2016-02-24 DIAGNOSIS — D689 Coagulation defect, unspecified: Secondary | ICD-10-CM

## 2016-02-24 DIAGNOSIS — S3992XD Unspecified injury of lower back, subsequent encounter: Secondary | ICD-10-CM

## 2016-02-24 DIAGNOSIS — D62 Acute posthemorrhagic anemia: Secondary | ICD-10-CM

## 2016-02-24 DIAGNOSIS — D696 Thrombocytopenia, unspecified: Secondary | ICD-10-CM

## 2016-02-24 LAB — PROTIME-INR
INR: 2.28 — ABNORMAL HIGH (ref 0.00–1.49)
INR: 1.91 — ABNORMAL HIGH (ref 0.00–1.49)
Prothrombin Time: 24.9 seconds — ABNORMAL HIGH (ref 11.6–15.2)
Prothrombin Time: 21.8 seconds — ABNORMAL HIGH (ref 11.6–15.2)

## 2016-02-24 LAB — CBC
HCT: 21.4 % — ABNORMAL LOW (ref 39.0–52.0)
HCT: 21.8 % — ABNORMAL LOW (ref 39.0–52.0)
HCT: 20 % — ABNORMAL LOW (ref 39.0–52.0)
HEMATOCRIT: 21.4 % — AB (ref 39.0–52.0)
HEMATOCRIT: 23.3 % — AB (ref 39.0–52.0)
HEMOGLOBIN: 7 g/dL — AB (ref 13.0–17.0)
HEMOGLOBIN: 8.1 g/dL — AB (ref 13.0–17.0)
Hemoglobin: 7.6 g/dL — ABNORMAL LOW (ref 13.0–17.0)
Hemoglobin: 7.6 g/dL — ABNORMAL LOW (ref 13.0–17.0)
Hemoglobin: 7.6 g/dL — ABNORMAL LOW (ref 13.0–17.0)
MCH: 32.2 pg (ref 26.0–34.0)
MCH: 32.5 pg (ref 26.0–34.0)
MCH: 33.1 pg (ref 26.0–34.0)
MCH: 33.8 pg (ref 26.0–34.0)
MCH: 32.1 pg (ref 26.0–34.0)
MCHC: 34.9 g/dL (ref 30.0–36.0)
MCHC: 35.5 g/dL (ref 30.0–36.0)
MCHC: 34.8 g/dL (ref 30.0–36.0)
MCHC: 35.5 g/dL (ref 30.0–36.0)
MCHC: 35 g/dL (ref 30.0–36.0)
MCV: 91.5 fL (ref 78.0–100.0)
MCV: 91.7 fL (ref 78.0–100.0)
MCV: 92.4 fL (ref 78.0–100.0)
MCV: 95.1 fL (ref 78.0–100.0)
MCV: 95.1 fL (ref 78.0–100.0)
PLATELETS: 66 10*3/uL — AB (ref 150–400)
PLATELETS: 52 10*3/uL — AB (ref 150–400)
PLATELETS: 47 10*3/uL — AB (ref 150–400)
PLATELETS: 44 10*3/uL — AB (ref 150–400)
Platelets: 61 10*3/uL — ABNORMAL LOW (ref 150–400)
RBC: 2.18 MIL/uL — AB (ref 4.22–5.81)
RBC: 2.25 MIL/uL — AB (ref 4.22–5.81)
RBC: 2.34 MIL/uL — ABNORMAL LOW (ref 4.22–5.81)
RBC: 2.36 MIL/uL — AB (ref 4.22–5.81)
RBC: 2.45 MIL/uL — ABNORMAL LOW (ref 4.22–5.81)
RDW: 18.3 % — AB (ref 11.5–15.5)
RDW: 17.9 % — ABNORMAL HIGH (ref 11.5–15.5)
RDW: 16.8 % — AB (ref 11.5–15.5)
RDW: 17.3 % — AB (ref 11.5–15.5)
RDW: 18.3 % — ABNORMAL HIGH (ref 11.5–15.5)
WBC: 10.9 10*3/uL — ABNORMAL HIGH (ref 4.0–10.5)
WBC: 12.3 10*3/uL — AB (ref 4.0–10.5)
WBC: 14.6 10*3/uL — ABNORMAL HIGH (ref 4.0–10.5)
WBC: 14.8 10*3/uL — ABNORMAL HIGH (ref 4.0–10.5)
WBC: 15.2 10*3/uL — ABNORMAL HIGH (ref 4.0–10.5)

## 2016-02-24 LAB — GLUCOSE, CAPILLARY: GLUCOSE-CAPILLARY: 146 mg/dL — AB (ref 65–99)

## 2016-02-24 LAB — PREPARE RBC (CROSSMATCH)

## 2016-02-24 LAB — TYPE AND SCREEN
ABO/RH(D): B POS
Antibody Screen: NEGATIVE
UNIT DIVISION: 0

## 2016-02-24 LAB — APTT: APTT: 38 s — AB (ref 24–37)

## 2016-02-24 LAB — POCT I-STAT 4, (NA,K, GLUC, HGB,HCT)
GLUCOSE: 112 mg/dL — AB (ref 65–99)
HCT: 30 % — ABNORMAL LOW (ref 39.0–52.0)
Hemoglobin: 10.2 g/dL — ABNORMAL LOW (ref 13.0–17.0)
Potassium: 4.4 mmol/L (ref 3.5–5.1)
Sodium: 140 mmol/L (ref 135–145)

## 2016-02-24 LAB — PREPARE PLATELET PHERESIS: Unit division: 0

## 2016-02-24 LAB — FIBRINOGEN: Fibrinogen: 129 mg/dL — ABNORMAL LOW (ref 204–475)

## 2016-02-24 MED ORDER — MORPHINE SULFATE (PF) 2 MG/ML IV SOLN
1.0000 mg | INTRAVENOUS | Status: DC | PRN
  Administered 2016-02-24 (×2): 2 mg via INTRAVENOUS
  Administered 2016-02-24: 4 mg via INTRAVENOUS
  Administered 2016-02-24: 2 mg via INTRAVENOUS
  Administered 2016-02-24: 1 mg via INTRAVENOUS
  Administered 2016-02-24: 4 mg via INTRAVENOUS
  Administered 2016-02-25: 2 mg via INTRAVENOUS
  Administered 2016-02-25: 3 mg via INTRAVENOUS
  Administered 2016-02-25 (×3): 2 mg via INTRAVENOUS
  Filled 2016-02-24 (×3): qty 1
  Filled 2016-02-24: qty 2
  Filled 2016-02-24 (×2): qty 1
  Filled 2016-02-24: qty 2
  Filled 2016-02-24: qty 1
  Filled 2016-02-24: qty 2
  Filled 2016-02-24: qty 1
  Filled 2016-02-24: qty 2

## 2016-02-24 MED ORDER — OXYCODONE HCL 5 MG PO TABS
10.0000 mg | ORAL_TABLET | Freq: Four times a day (QID) | ORAL | Status: DC | PRN
  Administered 2016-02-24 (×2): 10 mg via ORAL
  Filled 2016-02-24 (×2): qty 2

## 2016-02-24 MED ORDER — SODIUM CHLORIDE 0.9 % IV SOLN: Freq: Once | INTRAVENOUS | Status: DC

## 2016-02-24 MED ORDER — OXYCODONE HCL 5 MG PO TABS
15.0000 mg | ORAL_TABLET | Freq: Four times a day (QID) | ORAL | Status: DC | PRN
  Administered 2016-02-24 – 2016-03-01 (×22): 15 mg via ORAL
  Filled 2016-02-24 (×22): qty 3

## 2016-02-24 MED ORDER — DEXAMETHASONE SODIUM PHOSPHATE 10 MG/ML IJ SOLN
6.0000 mg | Freq: Once | INTRAMUSCULAR | Status: AC
  Administered 2016-02-24: 6 mg via INTRAVENOUS
  Filled 2016-02-24: qty 1

## 2016-02-24 MED FILL — Heparin Sodium (Porcine) Inj 1000 Unit/ML: INTRAMUSCULAR | Qty: 30 | Status: AC

## 2016-02-24 MED FILL — Sodium Chloride Irrigation Soln 0.9%: Qty: 3000 | Status: AC

## 2016-02-24 MED FILL — Sodium Chloride IV Soln 0.9%: INTRAVENOUS | Qty: 1000 | Status: AC

## 2016-02-24 NOTE — Progress Notes (Signed)
Pt requesting more pain medication, BP less than goal. Patient educated on pain medication effects on blood pressure and why it is important that his blood pressure to stabilize before more pain medication can be administered. Pt in no apparent distress at this time. CCM called, and awaiting call back from MD. Will continue to monitor. Dicie BeamFrazier, [place] Cordts RN BSN

## 2016-02-24 NOTE — Evaluation (Signed)
Occupational Therapy Evaluation Patient Details Name: Kenneth Obrien MRN: [id number] DOB: 1951/08/11 Today's Date: 02/24/2016    History of Present Illness 65 yo male s/p L4-5 L5-s1 laminectomy PMH: HTN chronic insomina COPD anxiety GERD arthritis hepatitis diverticulitis cirrhosis   Clinical Impression   Patient is s/p L4-5 L5-S1 surgery resulting in functional limitations due to the deficits listed below (see OT problem list). PTA was independent from home alone.  Patient will benefit from skilled OT acutely to increase independence and safety with ADLS to allow discharge SNF. Pain currently limiting patient and cues for safety. Pt agreeable to continued therapy at this time.      Follow Up Recommendations  SNF    Equipment Recommendations  Other (comment) (defer to SNF and update if denied)    Recommendations for Other Services       Precautions / Restrictions Precautions Precautions: Back;Fall Precaution Comments: handout provided and reviewed in detail Required Braces or Orthoses: Spinal Brace Spinal Brace: Lumbar corset;Applied in sitting position      Mobility Bed Mobility Overal bed mobility: Needs Assistance Bed Mobility: Sit to Supine       Sit to supine: Max assist   General bed mobility comments: pt able to follow sequence following precautions but requires (A) for BIL LE and position in bed  Transfers Overall transfer level: Needs assistance Equipment used: Rolling walker (2 wheeled) Transfers: Sit to/from Stand Sit to Stand: Mod assist         General transfer comment: pt provided mod cue to sequence and back precautions    Balance                                            ADL Overall ADL's : Needs assistance/impaired Eating/Feeding: Independent   Grooming: Wash/dry hands;Independent   Upper Body Bathing: Moderate assistance;Sitting   Lower Body Bathing: Maximal assistance;Sit to/from stand           Toilet  Transfer: +2 for physical assistance;Minimal assistance             General ADL Comments: Pt transfered from chair to bed to decr prolonged sitting and educated patient on the need for frequent changes of position. pt educated on lateral lean to help progress toward Edge of chair and scoot back into the bed. pt unable to cross bil LE but reports at baseline able to complete at EOB for dressing . Pt tolerated static standing with RW and states "i am doing better this time" Pt able to recall 2 out 3 precautions ( omitting twisting precaution)       Vision Additional Comments: needed incr time to scan and find    Perception     Praxis      Pertinent Vitals/Pain       Hand Dominance Right   Extremity/Trunk Assessment Upper Extremity Assessment Upper Extremity Assessment: Generalized weakness   Lower Extremity Assessment Lower Extremity Assessment: Defer to PT evaluation   Cervical / Trunk Assessment Cervical / Trunk Assessment: Other exceptions (surg)   Communication Communication Communication: No difficulties   Cognition Arousal/Alertness: Awake/alert Behavior During Therapy: [facility] for tasks assessed/performed                       General Comments       Exercises       Shoulder Instructions  Home Living Family/patient expects to be discharged to:: Private residence Living Arrangements: Alone Available Help at Discharge: Other (Comment) Type of Home: Mobile home Home Access: Stairs to enter;Ramped entrance Entrance Stairs-Number of Steps: 3   Home Layout: One level     Bathroom Shower/Tub: Tub/shower unit;Curtain   FirefighterBathroom Toilet: Standard     Home Equipment: Environmental consultantWalker - 2 wheels;Cane - quad;Shower seat;Toilet riser          Prior Functioning/Environment Level of Independence: Independent with assistive device(s)        Comments: used cane for mobility, was able to bath and dress on his own    OT Diagnosis: Generalized weakness;Acute  pain   OT Problem List: Decreased strength;Decreased activity tolerance;Decreased safety awareness;Decreased knowledge of use of DME or AE;Decreased knowledge of precautions;Impaired balance (sitting and/or standing);Cardiopulmonary status limiting activity;Pain;Impaired UE functional use   OT Treatment/Interventions: Self-care/ADL training;Therapeutic exercise;DME and/or AE instruction;Therapeutic activities;Patient/family education;Balance training    OT Goals(Current goals can be found in the care plan section) Acute Rehab OT Goals Patient Stated Goal: none stated OT Goal Formulation: With patient Time For Goal Achievement: 03/09/16 Potential to Achieve Goals: Good  OT Frequency: Min 2X/week   Barriers to D/C: Decreased caregiver support          Co-evaluation              End of Session Equipment Utilized During Treatment: Gait belt;Rolling walker;Back brace Nurse Communication: Mobility status;Precautions  Activity Tolerance: Patient tolerated treatment well Patient left: in bed;with call bell/phone within reach;with bed alarm set;with nursing/sitter in room   Time: [id number] OT Time Calculation (min): 32 min Charges:  OT General Charges $OT Visit: 1 Procedure OT Evaluation $OT Eval Moderate Complexity: 1 Procedure OT Treatments $Self Care/Home Management : 8-22 mins G-Codes:    Kenneth Obrien 02/24/2016, 9:41 AM  Kenneth Obrien   OTR/L Pager: [phone]: [phone] Office: [phone] .

## 2016-02-24 NOTE — Progress Notes (Signed)
PULMONARY / CRITICAL CARE MEDICINE   Name: Kenneth Obrien MRN: [id number] DOB: 1951/06/24    ADMISSION DATE:  02/23/2016 CONSULTATION DATE:  02/23/2016  REFERRING MD:  EDP  CHIEF COMPLAINT:  Lethargy/hypotension  BREIF: 65 y/o male with Hepatitis C cirrhosis underwent an elective lumbar spinal surgery decompression on 5/1 and lost 1500c of blood so he was sent to the ICU for further management.     SUBJECTIVE:  Received PRBC and FFP overnight 700cc bloody drainage in drains overnight Complains of pain   VITAL SIGNS: BP 103/54 mmHg  Pulse 83  Temp(Src) 99.3 F (37.4 C) (Oral)  Resp 16  Ht 6' (1.829 m)  Wt 89.812 kg (198 lb)  BMI 26.85 kg/m2  SpO2 98%  HEMODYNAMICS:    VENTILATOR SETTINGS:    INTAKE / OUTPUT: I/O last 3 completed shifts: In: 5433.8 [P.O.:50; I.V.:3195.8; Blood:1138; IV Piggyback:1050] Out: 4105 [Urine:1265; Drains:1340; Blood:1500]  PHYSICAL EXAMINATION: General:  Thin male no distress HEENT: NCAT OP clear PULM: CTA B, normal effort CV: RRR, no mgr GI: BS+, soft, nontender Derm: no bruising, lumbar drains in place Neuro: awake, alert, sitting up in bed   LABS:  BMET  Recent Labs Lab 02/23/16 1125 02/23/16 1643  NA 140 138  K 4.4 4.7  CL  --  111  CO2  --  21*  BUN  --  6  CREATININE  --  1.03  GLUCOSE 112* 156*    Electrolytes  Recent Labs Lab 02/23/16 1643  CALCIUM 7.7*    CBC  Recent Labs Lab 02/23/16 2131 02/24/16 0030 02/24/16 0514  WBC 13.9* 14.8* 15.2*  HGB 8.9* 8.1* 7.6*  HCT 25.6* 23.3* 21.4*  PLT 63* 61* 66*    Coag's  Recent Labs Lab 02/23/16 0632 02/23/16 1721 02/24/16 0514  APTT 42* 50* 38*  INR 1.77* 2.42* 2.28*    Sepsis Markers No results for input(s): LATICACIDVEN, PROCALCITON, O2SATVEN in the last 168 hours.  ABG No results for input(s): PHART, PCO2ART, PO2ART in the last 168 hours.  Liver Enzymes No results for input(s): AST, ALT, ALKPHOS, BILITOT, ALBUMIN in the last 168  hours.  Cardiac Enzymes  Recent Labs Lab 02/23/16 1721  TROPONINI <0.03    Glucose No results for input(s): GLUCAP in the last 168 hours.  Imaging Dg Lumbar Spine 2-3 Views  02/23/2016  CLINICAL DATA:  Operative imaging from lumbar spine fusion. EXAM: LUMBAR SPINE - 2-3 VIEW; DG C-ARM 61-120 MIN COMPARISON:  None. FINDINGS: Pedicle screws and interconnecting rods fuse L4, L5 and S1. Pedicle screws are well positioned and well seated. There are radiolucent disc spacers well centered maintaining disc height the L4-L5 and L5-S1 levels. IMPRESSION: Operative imaging from L4 through S1 posterior lumbar spine fusion. Electronically Signed   By: Amie Portland M.D.   On: 02/23/2016 15:31   Dg C-arm 61-120 Min  02/23/2016  CLINICAL DATA:  Operative imaging from lumbar spine fusion. EXAM: LUMBAR SPINE - 2-3 VIEW; DG C-ARM 61-120 MIN COMPARISON:  None. FINDINGS: Pedicle screws and interconnecting rods fuse L4, L5 and S1. Pedicle screws are well positioned and well seated. There are radiolucent disc spacers well centered maintaining disc height the L4-L5 and L5-S1 levels. IMPRESSION: Operative imaging from L4 through S1 posterior lumbar spine fusion. Electronically Signed   By: Amie Portland M.D.   On: 02/23/2016 15:31     STUDIES:    CULTURES:   ANTIBIOTICS: Periop ancef  SIGNIFICANT EVENTS: 5/1 > L4-5 L5-S1 Posterior lumbar interbody fusion, post op  hypotnension  LINES/TUBES:   DISCUSSION: 65 year old male with hepatic cirrhosis secondary to Hepatitis C post treatment. Had an elective lumbar spinal surgery on 5/1 and lost a significant amount of blood.  PCCM consulted for medical management in the ICU.  ASSESSMENT / PLAN:  PULMONARY A: COPD without acute exacerbation  P:   Not on home bronchodilators PRN albuterol  CARDIOVASCULAR A:  Hypotension, resolved H/o HTN  P:  Telemetry monitoring Restart coreg tomorrow  RENAL A:   Non-AG metabolic acidosis P:   Monitor BMET  and UOP Replace electrolytes as needed KVO fluids   GASTROINTESTINAL A:   Hepatic cirrhosis secondary to hepatitis C (s/p treatment 09/2015) P:   Assess LFT Diet per primary  HEMATOLOGIC A:   Acute blood loss anemia Coagulopathy in setting cirrhosis Thrombocytopenia in setting cirrhosis  P:   Repeat CBC and INR later today Transfuse PRBC for hgb < 7gm/dL  INFECTIOUS A:   Surgical ppx  P:   Ancef per primary  ENDOCRINE A:   Hyperglycemia without history of DM  P:   Follow glucose on BMET in AM  NEUROLOGIC A:   Spondylolisthesis L4-5 L5-S1 with stenosis lumbar radiculopathy status post surgical decompression and fixation 5/1 Chronic pain P:   Management per neurosurgery Hold xanax, ambien while hypotensive Increase oxycodone dose  FAMILY  - Updates: patient updated by BQ 5/1  OK to transfer out of ICU on 5/2 if Hgb stable and bleeding stable  - Inter-disciplinary family meet or Palliative Care meeting due by:  5/8  Heber CarolinaBrent McQuaid, MD [place] [facility] Pager: [phone] Cell: [phone] After 3pm or if no response, call [phone]

## 2016-02-24 NOTE — Progress Notes (Signed)
eLink Physician-Brief Progress Note Patient Name: Kenneth MarketStephen C Natzke DOB: May 28, 1951 MRN: [id number]   Date of Service  02/24/2016  HPI/Events of Note  Contacted by bedside nurse regarding ongoing pain. Camera check shows pain laying in bed with family at bedside. Currently watching TV. Systolic blood pressure 100 & not tachycardic.  Patient appears relaxed. Complaining of pain 10/10. Reports morphine better controlled his discomfort. Chronically takes oxycodone immediate release 20 mg as needed at home.  eICU Interventions  1. Discontinue fentanyl 2. Oxycodone IR 10 mg by mouth every 6 hours when necessary For severe pain 3. Morphine 1-4 milligram every 2 hour when necessary breakthrough pain with instructions to use oxycodone first 4. Continue ICU monitoring     Intervention Category Major Interventions: Shock - evaluation and management;Other:  Lawanda CousinsJennings Horace Lukas 02/24/2016, 12:39 AM

## 2016-02-24 NOTE — Progress Notes (Signed)
Notified Elink of HGB 7.6.

## 2016-02-24 NOTE — Care Management Note (Signed)
Case Management Note  Patient Details  Name: Kenneth Obrien MRN: [id number] Date of Birth: Oct 19, 1951  Subjective/Objective:   Pt admitted on 02/23/16 s/p L4-5, L5-S1 laminectomy.  PTA, pt independent, lives alone.                   Action/Plan: PT recommending SNF at dc; CSW consulted to facilitate dc to SNF for rehab when medically stable for dc.    Expected Discharge Date:                  Expected Discharge Plan:  Skilled Nursing Facility  In-House Referral:  Clinical Social Work  Discharge planning Services  CM Consult  Post Acute Care Choice:    Choice offered to:     DME Arranged:    DME Agency:     HH Arranged:    HH Agency:     Status of Service:  In process, will continue to follow  Medicare Important Message Given:    Date Medicare IM Given:    Medicare IM give by:    Date Additional Medicare IM Given:    Additional Medicare Important Message give by:     If discussed at Long Length of Stay Meetings, dates discussed:    Additional Comments:  Quintella BatonJulie W. Amandeep Hogston, RN, BSN  Trauma/Neuro ICU Case Manager [phone]

## 2016-02-24 NOTE — Progress Notes (Signed)
eLink Physician-Brief Progress Note Patient Name: Kenneth MarketStephen C Obrien DOB: 25-Feb-1951 MRN: [id number]   Date of Service  02/24/2016  HPI/Events of Note  Anemia - Hgb = 7.0. Hemodynamically stable off of vasopressors.   eICU Interventions  Will transfuse for Hgb < 7.0.     Intervention Category Intermediate Interventions: Other:  [name],[name] 02/24/2016, 9:56 PM

## 2016-02-24 NOTE — Evaluation (Signed)
Physical Therapy Evaluation Patient Details Name: Kenneth Obrien MRN: [id number] DOB: 06-Aug-1951 Today's Date: 02/24/2016   History of Present Illness  65 yo male s/p L4-5 L5-s1 laminectomy PMH: HTN chronic insomina COPD anxiety GERD arthritis hepatitis diverticulitis cirrhosis  Clinical Impression  Patient demonstrates deficits in functional mobility as indicated below. Will benefit from continued skilled PT to address deficits and maximize function. Will see as indicated and progress as tolerated.   OF NOTE: patient limited by pain and anxiety, nsg aware Additionally, VSS with BP 100s/ 60s    Follow Up Recommendations SNF;Supervision/Assistance - 24 hour    Equipment Recommendations  Rolling walker with 5" wheels    Recommendations for Other Services       Precautions / Restrictions Precautions Precautions: Back;Fall Precaution Comments: verbally educated on precautions and reviewed technique for patient safety Required Braces or Orthoses: Spinal Brace Spinal Brace: Lumbar corset;Applied in sitting position      Mobility  Bed Mobility Overal bed mobility: Needs Assistance Bed Mobility: Rolling;Sidelying to Sit Rolling: Min guard Sidelying to sit: Mod assist       General bed mobility comments: VCs for technique, Moderate assist to elevate to upright at EOB   Transfers Overall transfer level: Needs assistance Equipment used: 1 person hand held assist Transfers: Sit to/from [employer] Sit to Stand: Mod assist Stand pivot transfers: Mod assist;+2 physical assistance;From elevated surface       General transfer comment: Patient extremely anxious during sit<>stand transfers, maximal reassurance provided. Patient self limiting. Assist patient with stability in standing and facilitation of pivot to chair  Ambulation/Gait             General Gait Details: did not perform  Stairs            Wheelchair Mobility    Modified Rankin  (Stroke Patients Only)       Balance Overall balance assessment: Needs assistance Sitting-balance support: Feet supported Sitting balance-Leahy Scale: Fair     Standing balance support: During functional activity Standing balance-Leahy Scale: Poor                               Pertinent Vitals/Pain Pain Assessment: 0-10 Pain Score: 5  Pain Location: Back Pain Descriptors / Indicators: Stabbing Pain Intervention(s): Limited activity within patient's tolerance;Monitored during session;Repositioned;Patient requesting pain meds-RN notified    Home Living Family/patient expects to be discharged to:: Private residence Living Arrangements: Alone Available Help at Discharge: Other (Comment) Type of Home: Mobile home Home Access: Stairs to enter;Ramped entrance   Entrance Stairs-Number of Steps: 3 Home Layout: One level Home Equipment: Walker - 2 wheels;Cane - quad;Shower seat;Toilet riser      Prior Function Level of Independence: Independent with assistive device(s)         Comments: used cane for mobility, was able to bath and dress on his own     Hand Dominance   Dominant Hand: Right    Extremity/Trunk Assessment   Upper Extremity Assessment: Generalized weakness           Lower Extremity Assessment: Generalized weakness      Cervical / Trunk Assessment:  (lumbar surgery)  Communication   Communication: No difficulties  Cognition Arousal/Alertness: Awake/alert Behavior During Therapy: Anxious Overall Cognitive Status: Within Functional Limits for tasks assessed                      General Comments General comments (  skin integrity, edema, etc.): drain in place, dressing reinforced during session by nsg    Exercises        Assessment/Plan    PT Assessment Patient needs continued PT services  PT Diagnosis Difficulty walking;Abnormality of gait;Generalized weakness;Acute pain   PT Problem List Decreased strength;Decreased  range of motion;Decreased activity tolerance;Decreased balance;Decreased mobility;Decreased coordination;Decreased knowledge of use of DME;Decreased safety awareness;Pain  PT Treatment Interventions DME instruction;Gait training;Functional mobility training;Therapeutic activities;Therapeutic exercise;Balance training;Patient/family education   PT Goals (Current goals can be found in the Care Plan section) Acute Rehab PT Goals Patient Stated Goal: none stated PT Goal Formulation: With patient/family Time For Goal Achievement: 03/09/16 Potential to Achieve Goals: Good    Frequency Min 5X/week   Barriers to discharge Decreased caregiver support      Co-evaluation               End of Session Equipment Utilized During Treatment: Gait belt;Back brace Activity Tolerance: Patient limited by pain;Other (comment) (anxiety) Patient left: in chair;with call bell/phone within reach;with family/visitor present Nurse Communication: Mobility status;Precautions         Time: [id number] PT Time Calculation (min) (ACUTE ONLY): 26 min   Charges:   PT Evaluation $PT Eval Moderate Complexity: 1 Procedure PT Treatments $Therapeutic Activity: 8-22 mins   PT G CodesFabio Asa:        Aleksi Brummet J 02/24/2016, 10:23 AM Charlotte Crumbevon Seaton Hofmann, PT DPT  [phone]

## 2016-02-24 NOTE — Progress Notes (Signed)
Patient ID: Nyra MarketStephen C Timberlake, male   DOB: 1951-10-01, 65 y.o.   MRN: [id number] Vital signs are stable although blood pressure has been a bit saggy. He has required several units of transfusion since surgery His drain continues to put out sizable quantities of clotting bloody fluid He complains of significant pain His motor function appears good Appreciate the help of critical care and managing medical issues Slowly recovering from surgery Would appreciate him staying in the intensive care unit until his drainage further stabilizes and his hematocrit also stabilized

## 2016-02-24 NOTE — Progress Notes (Signed)
eLink Physician-Brief Progress Note Patient Name: Kenneth MarketStephen C Obrien DOB: 08-15-1951 MRN: [id number]   Date of Service  02/24/2016  HPI/Events of Note  Contacted by bedside nurse regarding worsening anemia. Continuing to have some output from surgical drains but this appears to be slowing. Platelet count 66. Hemoglobin 7.6.  INR 2.28 & PTT 38. Patient received vitamin K 10 mg IV yesterday. Patient also underwent transfusion of PRBC  And platelets.Reviewed  Consultation note with transfusion goals.Spoke with patient's bedside nurse regarding following interventions.  eICU Interventions  1. Transfuse 1 unit PRBC 2. Transfuse 2 units FFP 3. At on fibrinogen to previous coags 4. Posttransfusion CBC, INR, & PTT     Intervention Category Major Interventions: Hemorrhage - evaluation and management  Lawanda CousinsJennings Sekai Nayak 02/24/2016, 6:26 AM

## 2016-02-25 DIAGNOSIS — J449 Chronic obstructive pulmonary disease, unspecified: Secondary | ICD-10-CM

## 2016-02-25 DIAGNOSIS — K7460 Unspecified cirrhosis of liver: Secondary | ICD-10-CM

## 2016-02-25 DIAGNOSIS — G6289 Other specified polyneuropathies: Secondary | ICD-10-CM

## 2016-02-25 DIAGNOSIS — I10 Essential (primary) hypertension: Secondary | ICD-10-CM

## 2016-02-25 DIAGNOSIS — D72829 Elevated white blood cell count, unspecified: Secondary | ICD-10-CM

## 2016-02-25 DIAGNOSIS — M549 Dorsalgia, unspecified: Secondary | ICD-10-CM | POA: Insufficient documentation

## 2016-02-25 DIAGNOSIS — B182 Chronic viral hepatitis C: Secondary | ICD-10-CM

## 2016-02-25 DIAGNOSIS — M5442 Lumbago with sciatica, left side: Secondary | ICD-10-CM

## 2016-02-25 DIAGNOSIS — R0689 Other abnormalities of breathing: Secondary | ICD-10-CM

## 2016-02-25 DIAGNOSIS — D62 Acute posthemorrhagic anemia: Secondary | ICD-10-CM | POA: Insufficient documentation

## 2016-02-25 DIAGNOSIS — G4733 Obstructive sleep apnea (adult) (pediatric): Secondary | ICD-10-CM

## 2016-02-25 DIAGNOSIS — G629 Polyneuropathy, unspecified: Secondary | ICD-10-CM | POA: Insufficient documentation

## 2016-02-25 DIAGNOSIS — D696 Thrombocytopenia, unspecified: Secondary | ICD-10-CM | POA: Insufficient documentation

## 2016-02-25 LAB — PREPARE FRESH FROZEN PLASMA
UNIT DIVISION: 0
Unit division: 0

## 2016-02-25 LAB — BASIC METABOLIC PANEL
Anion gap: 5 (ref 5–15)
BUN: 10 mg/dL (ref 6–20)
CHLORIDE: 108 mmol/L (ref 101–111)
CO2: 25 mmol/L (ref 22–32)
CREATININE: 0.77 mg/dL (ref 0.61–1.24)
Calcium: 7.6 mg/dL — ABNORMAL LOW (ref 8.9–10.3)
Glucose, Bld: 132 mg/dL — ABNORMAL HIGH (ref 65–99)
Potassium: 4.6 mmol/L (ref 3.5–5.1)
SODIUM: 138 mmol/L (ref 135–145)

## 2016-02-25 LAB — CBC
HCT: 20.3 % — ABNORMAL LOW (ref 39.0–52.0)
HCT: 19.4 % — ABNORMAL LOW (ref 39.0–52.0)
HEMATOCRIT: 22.5 % — AB (ref 39.0–52.0)
HEMOGLOBIN: 7.2 g/dL — AB (ref 13.0–17.0)
HEMOGLOBIN: 7.8 g/dL — AB (ref 13.0–17.0)
Hemoglobin: 6.8 g/dL — CL (ref 13.0–17.0)
MCH: 32.7 pg (ref 26.0–34.0)
MCH: 32.4 pg (ref 26.0–34.0)
MCH: 32.1 pg (ref 26.0–34.0)
MCHC: 35.5 g/dL (ref 30.0–36.0)
MCHC: 35.1 g/dL (ref 30.0–36.0)
MCHC: 34.7 g/dL (ref 30.0–36.0)
MCV: 92.3 fL (ref 78.0–100.0)
MCV: 92.4 fL (ref 78.0–100.0)
MCV: 92.6 fL (ref 78.0–100.0)
PLATELETS: 50 10*3/uL — AB (ref 150–400)
PLATELETS: 41 10*3/uL — AB (ref 150–400)
PLATELETS: 55 10*3/uL — AB (ref 150–400)
RBC: 2.2 MIL/uL — AB (ref 4.22–5.81)
RBC: 2.1 MIL/uL — AB (ref 4.22–5.81)
RBC: 2.43 MIL/uL — AB (ref 4.22–5.81)
RDW: 18.3 % — ABNORMAL HIGH (ref 11.5–15.5)
RDW: 18.3 % — AB (ref 11.5–15.5)
RDW: 17.7 % — ABNORMAL HIGH (ref 11.5–15.5)
WBC: 13.2 10*3/uL — ABNORMAL HIGH (ref 4.0–10.5)
WBC: 10.4 10*3/uL (ref 4.0–10.5)
WBC: 13.9 10*3/uL — AB (ref 4.0–10.5)

## 2016-02-25 LAB — PREPARE RBC (CROSSMATCH)

## 2016-02-25 MED ORDER — TAMSULOSIN HCL 0.4 MG PO CAPS
0.4000 mg | ORAL_CAPSULE | Freq: Every day | ORAL | Status: DC
  Administered 2016-02-25 – 2016-03-01 (×6): 0.4 mg via ORAL
  Filled 2016-02-25 (×6): qty 1

## 2016-02-25 MED ORDER — SODIUM CHLORIDE 0.9 % IV SOLN
Freq: Once | INTRAVENOUS | Status: AC
  Administered 2016-02-25: 11:00:00 via INTRAVENOUS

## 2016-02-25 NOTE — [place] FL2 (Signed)
[place] MEDICAID FL2 LEVEL OF CARE SCREENING TOOL     IDENTIFICATION  Patient Name: Kenneth MarketStephen C Hurlbut Birthdate: [birth year] Sex: male Admission Date (Current Location): 02/23/2016  [facility] [place]  [employer]   Facility and Address:  [facility], [address], [place], [place] [address]      Provider Number: [id number]  Attending Physician Name and Address:  Barnett AbuHenry Chassity Ludke, MD  Relative Name and Phone Number:       Current Level of Care: Hospital Recommended Level of Care: Skilled Nursing Facility Prior Approval Number:    Date Approved/Denied:   PASRR Number: [id number]  Discharge Plan: SNF    Current Diagnoses: Patient Active Problem List   Diagnosis Date Noted  . Back injuries 02/23/2016  . Lumbar stenosis with neurogenic claudication 02/23/2016    Orientation RESPIRATION BLADDER Height & Weight     Self, Time, Situation, Place  Normal Continent Weight: 89.812 kg (198 lb) Height:  6' (182.9 cm)  BEHAVIORAL SYMPTOMS/MOOD NEUROLOGICAL BOWEL NUTRITION STATUS   (NONE)  (NONE) Continent Diet (Heart Healthy)  AMBULATORY STATUS COMMUNICATION OF NEEDS Skin   Limited Assist Verbally Surgical wounds (Incision of back. Closed system drain 1 posterior, midline back accordiion.)                       Personal Care Assistance Level of Assistance  Bathing, Feeding, Dressing Bathing Assistance: Limited assistance Feeding assistance: Independent Dressing Assistance: Limited assistance     Functional Limitations Info  Hearing, Sight, Speech Sight Info: Adequate Hearing Info: Adequate Speech Info: Adequate    SPECIAL CARE FACTORS FREQUENCY  PT (By licensed PT), OT (By licensed OT)     PT Frequency: 5/week OT Frequency: 5/week            Contractures Contractures Info: Not present    Additional Factors Info  Allergies, Code Status Code Status Info: Full Allergies Info: NKDA           Current Medications  (02/25/2016):  This is the current hospital active medication list Current Facility-Administered Medications  Medication Dose Route Frequency Provider Last Rate Last Dose  . 0.9 %  sodium chloride infusion  250 mL Intravenous Continuous Barnett AbuHenry Findley Blankenbaker, MD   Stopped at 02/23/16 1530  . 0.9 %  sodium chloride infusion   Intravenous Once Duayne CalPaul W Hoffman, NP      . 0.9 %  sodium chloride infusion   Intravenous Once Roslynn AmbleJennings E Nestor, MD      . acetaminophen (TYLENOL) tablet 650 mg  650 mg Oral Q4H PRN Barnett AbuHenry Patriciann Becht, MD       Or  . acetaminophen (TYLENOL) suppository 650 mg  650 mg Rectal Q4H PRN Barnett AbuHenry Fabrizzio Marcella, MD      . alum & mag hydroxide-simeth (MAALOX/MYLANTA) 200-200-20 MG/5ML suspension 30 mL  30 mL Oral Q6H PRN Barnett AbuHenry Tzipporah Nagorski, MD      . bisacodyl (DULCOLAX) suppository 10 mg  10 mg Rectal Daily PRN Barnett AbuHenry Shyasia Funches, MD      . docusate sodium (COLACE) capsule 100 mg  100 mg Oral BID Barnett AbuHenry Chrys Landgrebe, MD   100 mg at 02/25/16 [id number]  . magnesium citrate solution 1 Bottle  1 Bottle Oral Once PRN Barnett AbuHenry Gregg Holster, MD      . menthol-cetylpyridinium (CEPACOL) lozenge 3 mg  1 lozenge Oral PRN Barnett AbuHenry Christalyn Goertz, MD   3 mg at 02/23/16 1629   Or  . phenol (CHLORASEPTIC) mouth spray 1 spray  1 spray Mouth/Throat  PRN Barnett Abu, MD      . methocarbamol (ROBAXIN) tablet 500 mg  500 mg Oral Q6H PRN Barnett Abu, MD   500 mg at 02/25/16 0234   Or  . methocarbamol (ROBAXIN) 500 mg in dextrose 5 % 50 mL IVPB  500 mg Intravenous Q6H PRN Barnett Abu, MD      . morphine 2 MG/ML injection 1-4 mg  1-4 mg Intravenous Q2H PRN Roslynn Amble, MD   2 mg at 02/25/16 1111  . ondansetron (ZOFRAN) injection 4 mg  4 mg Intravenous Q4H PRN Barnett Abu, MD      . oxyCODONE (Oxy IR/ROXICODONE) immediate release tablet 15 mg  15 mg Oral Q6H PRN Lupita Leash, MD   15 mg at 02/25/16 1202  . polyethylene glycol (MIRALAX / GLYCOLAX) packet 17 g  17 g Oral Daily PRN Barnett Abu, MD      . senna [facility]) tablet 8.6 mg  1 tablet Oral BID Barnett Abu, MD   8.6 mg at 02/24/16 0944  . sodium chloride flush (NS) 0.9 % injection 3 mL  3 mL Intravenous Q12H Barnett Abu, MD   3 mL at 02/25/16 0937  . sodium chloride flush (NS) 0.9 % injection 3 mL  3 mL Intravenous PRN Barnett Abu, MD      . tamsulosin (FLOMAX) capsule 0.4 mg  0.4 mg Oral Daily Lupita Leash, MD         Discharge Medications: Please see discharge summary for a list of discharge medications.  Relevant Imaging Results:  Relevant Lab Results:   Additional Information SSN: [id number]  Venita Lick, LCSW

## 2016-02-25 NOTE — Progress Notes (Signed)
Patient ID: Nyra MarketStephen C Zogg, male   DOB: [birth year], 65 y.o.   MRN: [id number] Vital signs are stable although blood pressure is still soft Hemoglobin 6.8 this morning Drain still putting out approximately 350 mL Patient feels weak but ambulation is starting Will ask for rehabilitation med consult regarding CIR

## 2016-02-25 NOTE — Consult Note (Signed)
Physical Medicine and Rehabilitation Consult  Reason for Consult: Neurogenic claudication with neuropathy and multiple medical issues.   Referring Physician: Dr. Danielle DessElsner.    HPI: Kenneth Obrien is a 65 y.o. male with history of HTN, COPD, OSA, cirrhosis, Hep C, back pain due to spondylolisthesis with stenosis L4/5 and L5/S1 with radiculopathy and severe peripheral neuropathy. He was deemed high surgical risk but elected to undergo decompression lumbar lam L4-S1 on 02/23/16.  Pre op INR 1.91 and he received platelets for thrombocytopenia prior to surgery. He had  1500 cc blood loss with 750 cc return via cell-saver.  Post op he had hypotension with lethargy requiring pressors. He was treated with Vitamin K and serial CBC per PCCM. He has had drop in H/H to 7.6 as well as drop in platelets and was transfused with I unit PRBC and 2 units FFP. Stable off pressors and to transfer out of ICU today if H/H stable.   Back drain continues to have significant bloody drainage.  PT/OT evaluations done yesterday. CIR recommended by MD for follow up therapy.    Review of Systems  HENT: Negative for hearing loss.   Eyes: Negative for blurred vision and double vision.  Respiratory: Positive for shortness of breath. Negative for cough.   Cardiovascular: Positive for leg swelling. Negative for chest pain and palpitations.  Gastrointestinal: Positive for heartburn and constipation. Negative for abdominal pain.  Genitourinary:       Difficulty starting stream  Musculoskeletal: Positive for myalgias and back pain.  Neurological: Positive for sensory change (RLE > LLE numbness) and focal weakness (LLE weaker since surgery. ). Negative for headaches.  Psychiatric/Behavioral: Negative for depression. The patient is nervous/anxious and has insomnia.   All other systems reviewed and are negative.     Past Medical History  Diagnosis Date  . Hypertension   . Chronic insomnia   . COPD (chronic obstructive  pulmonary disease) (HCC)   . Anxiety   . GERD (gastroesophageal reflux disease)     takes OTC  . Arthritis   . Hepatitis     had hep c   took tx ...last one in Dec. 2016  . Diverticulitis   . Cirrhosis of liver [facility](HCC)     Past Surgical History  Procedure Laterality Date  . Partial colectomy      took 19 inches in 1998 for diverticulitis  . Colon surgery    . Broken left ankle      no surgery done    History reviewed. No pertinent family history.    Social History:  Lives alone--Has been sedentary for past 2 years and stays in bed most of the day due to back pain. Has friends who can check in couple of times a week? He was independent with cane PTA. Independent. He has been smoking 1/2 PPD and using vapes.  He has a 20 pack-year smoking history. He does not have any smokeless tobacco history on file. He reports that he does not drink alcohol or use illicit drugs.    Allergies: No Known Allergies    Medications Prior to Admission  Medication Sig Dispense Refill  . ALPRAZolam (XANAX) 0.5 MG tablet Take 0.5 mg by mouth daily.    . carvedilol (COREG) 3.125 MG tablet Take 1.5625 mg by mouth 2 (two) times daily with a meal.    . Diphenhydramine-APAP, sleep, (PAIN RELIEVER/SLEEP AID PO) Take 0.5 tablets by mouth at bedtime.    . Oxycodone HCl 20 MG TABS Take  20 mg by mouth 3 (three) times daily as needed (for pain).     Marland Kitchen zolpidem (AMBIEN) 10 MG tablet Take 10 mg by mouth at bedtime.      Home: Home Living Family/patient expects to be discharged to:: Private residence Living Arrangements: Alone Available Help at Discharge: Other (Comment) Type of Home: Mobile home Home Access: Stairs to enter, Ramped entrance Entrance Stairs-Number of Steps: 3 Home Layout: One level Bathroom Shower/Tub: Tub/shower unit, Engineer, building services: Standard Home Equipment: Environmental consultant - 2 wheels, [employer] - quad, Information systems manager, Chartered certified accountant History: Prior Function Level of Independence:  Independent with assistive device(s) Comments: used cane for mobility, was able to bath and dress on his own Functional Status:  Mobility: Bed Mobility Overal bed mobility: Needs Assistance Bed Mobility: Rolling, Sidelying to Sit Rolling: Min guard Sidelying to sit: Mod assist General bed mobility comments: VCs for technique, Moderate assist to elevate to upright at EOB  Transfers Overall transfer level: Needs assistance Equipment used: 1 person hand held assist Transfers: Sit to/from Stand, Stand Pivot Transfers Sit to Stand: Mod assist Stand pivot transfers: Mod assist, +2 physical assistance, From elevated surface General transfer comment: Patient extremely anxious during sit<>stand transfers, maximal reassurance provided. Patient self limiting. Assist patient with stability in standing and facilitation of pivot to chair Ambulation/Gait General Gait Details: did not perform    ADL: ADL Overall ADL's : Needs assistance/impaired Eating/Feeding: Independent Grooming: Wash/dry hands, Independent Upper Body Bathing: Moderate assistance, Sitting Lower Body Bathing: Maximal assistance, Sit to/from stand Toilet Transfer: +2 for physical assistance, Minimal assistance General ADL Comments: Pt transfered from chair to bed to decr prolonged sitting and educated patient on the need for frequent changes of position. pt educated on lateral lean to help progress toward Edge of chair and scoot back into the bed. pt unable to cross bil LE but reports at baseline able to complete at EOB for dressing . Pt tolerated static standing with RW and states "i am doing better this time" Pt able to recall 2 out 3 precautions ( omitting twisting precaution)    Cognition: Cognition Overall Cognitive Status: Within Functional Limits for tasks assessed Orientation Level: Oriented X4 Cognition Arousal/Alertness: Awake/alert Behavior During Therapy: Anxious Overall Cognitive Status: Within Functional Limits  for tasks assessed   Blood pressure 108/58, pulse 55, temperature 97.9 F (36.6 C), temperature source Oral, resp. rate 14, height 6' (1.829 m), weight 89.812 kg (198 lb), SpO2 95 %. Physical Exam  Nursing note and vitals reviewed. Constitutional: He is oriented to person, place, and time. He appears well-developed and well-nourished.  HENT:  Head: Normocephalic and atraumatic.  Mouth/Throat: Oropharynx is clear and moist.  Eyes: Conjunctivae and EOM are normal. Pupils are equal, round, and reactive to light.  Neck: Normal range of motion. Neck supple.  Cardiovascular: Normal rate and regular rhythm.   Respiratory: Effort normal and breath sounds normal. No respiratory distress. He has no wheezes.  GI: Soft. Bowel sounds are normal. He exhibits no distension. There is no tenderness.  Musculoskeletal: He exhibits edema (1+ edema from knees down. ). He exhibits no tenderness.  Neurological: He is alert and oriented to person, place, and time.  Speech clear.  Able to follow commands without difficulty.  Motor: Bilateral upper extremities 5/5 proximal to distal Left lower extremity: 4+/5 hip flexion, knee extension, 4+/5 ankle dorsi/plantarflexion Left lower extremity: 4+/5 hip flexion, knee extension, 2+/5 ankle dorsi/plantarflexion Sensory deficits RLE > LLE.  DTRs 3+ left upper and left  lower extremity  Skin: Skin is warm and dry.  Psychiatric: He has a normal mood and affect. His behavior is normal. Judgment and thought content normal.    Results for orders placed or performed during the hospital encounter of 02/23/16 (from the past 24 hour(s))  Glucose, capillary     Status: Abnormal   Collection Time: 02/24/16 12:09 PM  Result Value Ref Range   Glucose-Capillary 146 (H) 65 - 99 mg/dL  Protime-INR     Status: Abnormal   Collection Time: 02/24/16  1:13 PM  Result Value Ref Range   Prothrombin Time 21.8 (H) 11.6 - 15.2 seconds   INR 1.91 (H) 0.00 - 1.49  Fibrinogen     Status:  Abnormal   Collection Time: 02/24/16  1:13 PM  Result Value Ref Range   Fibrinogen 129 (L) 204 - 475 mg/dL  CBC     Status: Abnormal   Collection Time: 02/24/16  1:13 PM  Result Value Ref Range   WBC 14.6 (H) 4.0 - 10.5 K/uL   RBC 2.36 (L) 4.22 - 5.81 MIL/uL   Hemoglobin 7.6 (L) 13.0 - 17.0 g/dL   HCT 16.1 (L) 09.6 - 04.5 %   MCV 92.4 78.0 - 100.0 fL   MCH 32.2 26.0 - 34.0 pg   MCHC 34.9 30.0 - 36.0 g/dL   RDW 40.9 (H) 81.1 - 91.4 %   Platelets 52 (L) 150 - 400 K/uL  CBC     Status: Abnormal   Collection Time: 02/24/16  4:23 PM  Result Value Ref Range   WBC 12.3 (H) 4.0 - 10.5 K/uL   RBC 2.34 (L) 4.22 - 5.81 MIL/uL   Hemoglobin 7.6 (L) 13.0 - 17.0 g/dL   HCT 78.2 (L) 95.6 - 21.3 %   MCV 91.5 78.0 - 100.0 fL   MCH 32.5 26.0 - 34.0 pg   MCHC 35.5 30.0 - 36.0 g/dL   RDW 08.6 (H) 57.8 - 46.9 %   Platelets 47 (L) 150 - 400 K/uL  CBC     Status: Abnormal   Collection Time: 02/24/16  9:10 PM  Result Value Ref Range   WBC 10.9 (H) 4.0 - 10.5 K/uL   RBC 2.18 (L) 4.22 - 5.81 MIL/uL   Hemoglobin 7.0 (L) 13.0 - 17.0 g/dL   HCT 62.9 (L) 52.8 - 41.3 %   MCV 91.7 78.0 - 100.0 fL   MCH 32.1 26.0 - 34.0 pg   MCHC 35.0 30.0 - 36.0 g/dL   RDW 24.4 (H) 01.0 - 27.2 %   Platelets 44 (L) 150 - 400 K/uL  CBC     Status: Abnormal   Collection Time: 02/25/16  1:12 AM  Result Value Ref Range   WBC 13.2 (H) 4.0 - 10.5 K/uL   RBC 2.20 (L) 4.22 - 5.81 MIL/uL   Hemoglobin 7.2 (L) 13.0 - 17.0 g/dL   HCT 53.6 (L) 64.4 - 03.4 %   MCV 92.3 78.0 - 100.0 fL   MCH 32.7 26.0 - 34.0 pg   MCHC 35.5 30.0 - 36.0 g/dL   RDW 74.2 (H) 59.5 - 63.8 %   Platelets 50 (L) 150 - 400 K/uL  Basic metabolic panel     Status: Abnormal   Collection Time: 02/25/16  5:15 AM  Result Value Ref Range   Sodium 138 135 - 145 mmol/L   Potassium 4.6 3.5 - 5.1 mmol/L   Chloride 108 101 - 111 mmol/L   CO2 25 22 - 32 mmol/L  Glucose, Bld 132 (H) 65 - 99 mg/dL   BUN 10 6 - 20 mg/dL   Creatinine, Ser 0.45 0.61 - 1.24 mg/dL     Calcium 7.6 (L) 8.9 - 10.3 mg/dL   GFR calc non Af Amer >60 >60 mL/min   GFR calc Af Amer >60 >60 mL/min   Anion gap 5 5 - 15  CBC     Status: Abnormal   Collection Time: 02/25/16  5:15 AM  Result Value Ref Range   WBC 10.4 4.0 - 10.5 K/uL   RBC 2.10 (L) 4.22 - 5.81 MIL/uL   Hemoglobin 6.8 (LL) 13.0 - 17.0 g/dL   HCT 40.9 (L) 81.1 - 91.4 %   MCV 92.4 78.0 - 100.0 fL   MCH 32.4 26.0 - 34.0 pg   MCHC 35.1 30.0 - 36.0 g/dL   RDW 78.2 (H) 95.6 - 21.3 %   Platelets 41 (L) 150 - 400 K/uL  Prepare fresh frozen plasma     Status: None (Preliminary result)   Collection Time: 02/25/16  8:46 AM  Result Value Ref Range   Unit Number [id number]    Blood Component Type THAWED PLASMA    Unit division 00    Status of Unit ALLOCATED    Transfusion Status OK TO TRANSFUSE    Unit Number [id number]    Blood Component Type THAWED PLASMA    Unit division 00    Status of Unit ALLOCATED    Transfusion Status OK TO TRANSFUSE   Prepare Pheresed Platelets     Status: None (Preliminary result)   Collection Time: 02/25/16  8:47 AM  Result Value Ref Range   Unit Number [id number]    Blood Component Type PLTP LR1 PAS    Unit division 00    Status of Unit ALLOCATED    Transfusion Status OK TO TRANSFUSE   Prepare RBC     Status: None   Collection Time: 02/25/16  8:48 AM  Result Value Ref Range   Order Confirmation ORDER PROCESSED BY BLOOD BANK    Dg Lumbar Spine 2-3 Views  02/23/2016  CLINICAL DATA:  Operative imaging from lumbar spine fusion. EXAM: LUMBAR SPINE - 2-3 VIEW; DG C-ARM 61-120 MIN COMPARISON:  None. FINDINGS: Pedicle screws and interconnecting rods fuse L4, L5 and S1. Pedicle screws are well positioned and well seated. There are radiolucent disc spacers well centered maintaining disc height the L4-L5 and L5-S1 levels. IMPRESSION: Operative imaging from L4 through S1 posterior lumbar spine fusion. Electronically Signed   By: Amie Portland M.D.   On: 02/23/2016 15:31   Dg C-arm  61-120 Min  02/23/2016  CLINICAL DATA:  Operative imaging from lumbar spine fusion. EXAM: LUMBAR SPINE - 2-3 VIEW; DG C-ARM 61-120 MIN COMPARISON:  None. FINDINGS: Pedicle screws and interconnecting rods fuse L4, L5 and S1. Pedicle screws are well positioned and well seated. There are radiolucent disc spacers well centered maintaining disc height the L4-L5 and L5-S1 levels. IMPRESSION: Operative imaging from L4 through S1 posterior lumbar spine fusion. Electronically Signed   By: Amie Portland M.D.   On: 02/23/2016 15:31    Assessment/Plan: Diagnosis: Neurogenic claudication status post L4-S1 lumbar decompression. Labs and images independently reviewed.  Records reviewed and summated above.  1. Does the need for close, 24 hr/day medical supervision in concert with the patient's rehab needs make it unreasonable for this patient to be served in a less intensive setting? Potentially  2. Co-Morbidities requiring supervision/potential complications:  HTN (monitor and provide prns  in accordance with increased physical exertion and pain), COPD (monitor her respiratory rate and O2 sats with increased physical activity), OSA (ensure appropriate mood and energy for therapies), cirrhosis (avoid hepatotoxic meds when possible), Hep C (see previous), chronic back pain (Biofeedback training with therapies to help reduce reliance on opiate pain medications, monitor pain control during therapies, and sedation at rest and titrate to maximum efficacy to ensure participation and gains in therapies), severe peripheral neuropathy, respiratory depression (attempt to limit respiratory depressing meds as much as possible), leukocytosis (cont to monitor for signs and symptoms of infection, further workup if indicated), Thrombocytopenia (< 60,000/mm3 no resistive exercise), ABLA (transfuse if necessary to ensure appropriate perfusion for increased activity tolerance) 3. Due to bladder management, safety, skin/wound care, pain  management and patient education, does the patient require 24 hr/day rehab nursing? Yes 4. Does the patient require coordinated care of a physician, rehab nurse, PT (1-2 hrs/day, 5 days/week) and OT (1-2 hrs/day, 5 days/week) to address physical and functional deficits in the context of the above medical diagnosis(es)? Potentially Addressing deficits in the following areas: balance, endurance, locomotion, strength, transferring, bathing, dressing, toileting and psychosocial support 5. Can the patient actively participate in an intensive therapy program of at least 3 hrs of therapy per day at least 5 days per week? Potentially 6. The potential for patient to make measurable gains while on inpatient rehab is good 7. Anticipated functional outcomes upon discharge from inpatient rehab are modified independent and supervision  with PT, modified independent and supervision with OT, n/a with SLP. 8. Estimated rehab length of stay to reach the above functional goals is: 14-16 days. 9. Does the patient have adequate social supports and living environment to accommodate these discharge functional goals? Potentially 10. Anticipated D/C setting: Other 11. Anticipated post D/C treatments: SNF 12. Overall Rehab/Functional Prognosis: good  RECOMMENDATIONS: This patient's condition is appropriate for continued rehabilitative care in the following setting: Patient does not appear to caregiver support at discharge. Further he has been sedentary, per report for years. He will unlikely to be of goal to  independent level of functioning after a short IRF stay.  Agree with therapies, recommend SNF once medically stable. Patient has agreed to participate in recommended program. Potentially Note that insurance prior authorization may be required for reimbursement for recommended care.  Comment: Rehab Admissions Coordinator to follow up.  Maryla Morrow, MD 02/25/2016

## 2016-02-25 NOTE — Progress Notes (Signed)
eLink Physician-Brief Progress Note Patient Name: Kenneth MarketStephen C Fassler DOB: 1951-10-25 MRN: [id number]   Date of Service  02/25/2016  HPI/Events of Note  Contacted regarding Hemoglobin 6.8. Patient's platelet count 41. Spoke with nurse reporting minimal output from the drain. Remains off of vasopressors and normotensive.  eICU Interventions  Continue to monitor cell counts. Holding on transfusion at this time.     Intervention Category Major Interventions: Hemorrhage - evaluation and management  Lawanda CousinsJennings Avamarie Crossley 02/25/2016, 6:12 AM

## 2016-02-25 NOTE — Progress Notes (Signed)
PULMONARY / CRITICAL CARE MEDICINE   Name: Kenneth Obrien MRN: [id number] DOB: 1951/04/13    ADMISSION DATE:  02/23/2016 CONSULTATION DATE:  02/23/2016  REFERRING MD:  EDP  CHIEF COMPLAINT:  Lethargy/hypotension  BREIF: 65 y/o male with Hepatitis C cirrhosis underwent an elective lumbar spinal surgery decompression on 5/1 and lost 1500c of blood so he was sent to the ICU for further management.     SUBJECTIVE:  Still having pain and bloody drainage   VITAL SIGNS: BP 97/50 mmHg  Pulse 55  Temp(Src) 98.3 F (36.8 C) (Oral)  Resp 8  Ht 6' (1.829 m)  Wt 89.812 kg (198 lb)  BMI 26.85 kg/m2  SpO2 95%  HEMODYNAMICS:    VENTILATOR SETTINGS:    INTAKE / OUTPUT: I/O last 3 completed shifts: In: 1571.2 [I.V.:714.2; Blood:857] Out: 1191 [Urine:3635; Drains:1060]  PHYSICAL EXAMINATION: General:  Thin male, was walking on first exam, talking on phone next exam HEENT: NCAT OP clear PULM: normal effort CV: extremities well perfused GI: non distended Derm: no bruising, lumbar drains in place Neuro: awake, alert, sitting up in bed   LABS:  BMET  Recent Labs Lab 02/23/16 1125 02/23/16 1643 02/25/16 0515  NA 140 138 138  K 4.4 4.7 4.6  CL  --  111 108  CO2  --  21* 25  BUN  --  6 10  CREATININE  --  1.03 0.77  GLUCOSE 112* 156* 132*    Electrolytes  Recent Labs Lab 02/23/16 1643 02/25/16 0515  CALCIUM 7.7* 7.6*    CBC  Recent Labs Lab 02/24/16 2110 02/25/16 0112 02/25/16 0515  WBC 10.9* 13.2* 10.4  HGB 7.0* 7.2* 6.8*  HCT 20.0* 20.3* 19.4*  PLT 44* 50* 41*    Coag's  Recent Labs Lab 02/23/16 0632 02/23/16 1721 02/24/16 0514 02/24/16 1313  APTT 42* 50* 38*  --   INR 1.77* 2.42* 2.28* 1.91*    Sepsis Markers No results for input(s): LATICACIDVEN, PROCALCITON, O2SATVEN in the last 168 hours.  ABG No results for input(s): PHART, PCO2ART, PO2ART in the last 168 hours.  Liver Enzymes No results for input(s): AST, ALT, ALKPHOS, BILITOT,  ALBUMIN in the last 168 hours.  Cardiac Enzymes  Recent Labs Lab 02/23/16 1721  TROPONINI <0.03    Glucose  Recent Labs Lab 02/24/16 1209  GLUCAP 146*    Imaging No results found.   STUDIES:    CULTURES:   ANTIBIOTICS: Periop ancef  SIGNIFICANT EVENTS: 5/1 > L4-5 L5-S1 Posterior lumbar interbody fusion, post op hypotnension  LINES/TUBES:   DISCUSSION: 65 year old male with hepatic cirrhosis secondary to Hepatitis C post treatment. Had an elective lumbar spinal surgery on 5/1 and lost a significant amount of blood.  PCCM consulted for medical management in the ICU.  ASSESSMENT / PLAN:  PULMONARY A: COPD without acute exacerbation  P:   Not on home bronchodilators PRN albuterol  CARDIOVASCULAR A:  Hypotension, resolved H/o HTN  P:  Telemetry monitoring Restart coreg tomorrow  RENAL A:   Non-AG metabolic acidosis P:   Monitor BMET and UOP Replace electrolytes as needed KVO fluids   GASTROINTESTINAL A:   Hepatic cirrhosis secondary to hepatitis C (s/p treatment 09/2015)  P:   Assess LFT Diet per primary  HEMATOLOGIC A:   Acute blood loss anemia > persistent Coagulopathy in setting cirrhosis Thrombocytopenia in setting cirrhosis  P:   Repeat CBC q shift Transfuse 1 U PRBC, PLT and 2 U FFP  INFECTIOUS A:  Surgical ppx  P:   Ancef per primary  ENDOCRINE A:   Hyperglycemia without history of DM  P:   Follow glucose on BMET in AM  NEUROLOGIC A:   Spondylolisthesis L4-5 L5-S1 with stenosis lumbar radiculopathy status post surgical decompression and fixation 5/1 Chronic pain P:   Management per neurosurgery Hold xanax, ambien while hypotensive Increase oxycodone dose  FAMILY  - Updates: patient updated by BQ 5/3  OK to transfer out of ICU on 5/3 if Hgb stable and bleeding stable  - Inter-disciplinary family meet or Palliative Care meeting due by:  5/8  Heber CarolinaBrent McQuaid, MD [place] PCCM Pager: [phone] Cell:  [phone] After 3pm or if no response, call [phone]

## 2016-02-25 NOTE — Clinical Social Work Placement (Signed)
   CLINICAL SOCIAL WORK PLACEMENT  NOTE  Date:  02/25/2016  Patient Details  Name: Kenneth Obrien MRN: [id number] Date of Birth: 1951-07-31  Clinical Social Work is seeking post-discharge placement for this patient at the Skilled  Nursing Facility level of care (*CSW will initial, date and re-position this form in  chart as items are completed):  Yes   Patient/family provided with [facility] Clinical Social Work Department's list of facilities offering this level of care within the geographic area requested by the patient (or if unable, by the patient's family).  Yes   Patient/family informed of their freedom to choose among providers that offer the needed level of care, that participate in Medicare, Medicaid or managed care program needed by the patient, have an available bed and are willing to accept the patient.  Yes   Patient/family informed of [name] ownership interest [name] and [name], as well as of the fact that they are under no obligation to receive care at these facilities.  PASRR submitted to [name] on 02/25/16     PASRR number received on 02/25/16     Existing PASRR number confirmed on       FL2 transmitted to all facilities in geographic area requested by pt/family on 02/25/16     FL2 transmitted to all facilities within larger geographic area on       Patient informed that his/her managed care company has contracts with or will negotiate with certain facilities, including the following:            Patient/family informed of bed offers received.  Patient chooses bed at       Physician recommends and patient chooses bed at      Patient to be transferred to   on  .  Patient to be transferred to facility by       Patient family notified on   of transfer.  Name of family member notified:        PHYSICIAN Please prepare priority discharge summary, including medications, Please prepare prescriptions, Please sign FL2     Additional  Comment:    _______________________________________________ Venita Lickampbell, Aalijah Lanphere B, LCSW 02/25/2016, 12:32 PM

## 2016-02-25 NOTE — Progress Notes (Signed)
Physical Therapy Treatment Patient Details Name: Kenneth MarketStephen C Ramakrishnan MRN: [id number] DOB: 10/20/1951 Today's Date: 02/25/2016    History of Present Illness 65 yo male s/p L4-5 L5-s1 laminectomy PMH: HTN chronic insomina COPD anxiety GERD arthritis hepatitis diverticulitis cirrhosis    PT Comments    Patient making progress towards PT goals, was able to ambulate with use of RW and assist. Increased time to perform. Reinforced education regarding back precautions during session. Will continue to see and progress as tolerated, continue to recommend SNF.   Follow Up Recommendations  SNF;Supervision/Assistance - 24 hour     Equipment Recommendations  Rolling walker with 5" wheels    Recommendations for Other Services       Precautions / Restrictions Precautions Precautions: Back;Fall Precaution Comments: handout provided and reviewed in detail Required Braces or Orthoses: Spinal Brace Spinal Brace: Lumbar corset;Applied in sitting position Restrictions Weight Bearing Restrictions: No    Mobility  Bed Mobility Overal bed mobility: Needs Assistance Bed Mobility: Sit to Supine Rolling: Min guard Sidelying to sit: Mod assist       General bed mobility comments: VCs for positioning and technique, assist to elevate trunk to upright positioning  Transfers Overall transfer level: Needs assistance Equipment used: Rolling walker (2 wheeled) Transfers: Sit to/from Stand Sit to Stand: Min assist         General transfer comment: min assist to elevate to standing with use of RW. VCs for hand placement and posture, increased time to perform  Ambulation/Gait Ambulation/Gait assistance: Min assist Ambulation Distance (Feet): 45 Feet Assistive device: Rolling walker (2 wheeled) Gait Pattern/deviations: Step-to pattern;Decreased stride length;Shuffle;Trunk flexed;Narrow base of support Gait velocity: decreased Gait velocity interpretation: <1.8 ft/sec, indicative of risk for  recurrent falls General Gait Details: heavy reliance on RW for support, max cues to faciliate stride. Patient cued verbally for upright posture. One standing rest break required   Stairs            Wheelchair Mobility    Modified Rankin (Stroke Patients Only)       Balance   Sitting-balance support: Feet supported Sitting balance-Leahy Scale: Fair     Standing balance support: During functional activity Standing balance-Leahy Scale: Poor                      Cognition Arousal/Alertness: Awake/alert Behavior During Therapy: WFL for tasks assessed/performed Overall Cognitive Status: Within Functional Limits for tasks assessed                      Exercises      General Comments General comments (skin integrity, edema, etc.): drain remains intact.  Verbally reviewed precautions with patient, patient able to recall 1 of 3       Pertinent Vitals/Pain Pain Assessment: 0-10 Pain Score: 4  Pain Location: back Pain Descriptors / Indicators: Sore Pain Intervention(s): Monitored during session    Home Living                      Prior Function            PT Goals (current goals can now be found in the care plan section) Acute Rehab PT Goals Patient Stated Goal: none stated PT Goal Formulation: With patient/family Time For Goal Achievement: 03/09/16 Potential to Achieve Goals: Good Progress towards PT goals: Progressing toward goals    Frequency  Min 5X/week    PT Plan Current plan remains appropriate    Co-evaluation  End of Session Equipment Utilized During Treatment: Gait belt;Back brace Activity Tolerance: Patient limited by fatigue Patient left: in chair;with call bell/phone within reach;with chair alarm set     Time: 4098-1191 PT Time Calculation (min) (ACUTE ONLY): 21 min  Charges:  $Gait Training: 8-22 mins                    G CodesFabio Asa Mar 06, 2016, 11:27 AM Charlotte Crumb, PT DPT   [phone]

## 2016-02-25 NOTE — Progress Notes (Signed)
CRITICAL VALUE ALERT  Critical value received:  hgb 6.8  Date of notification:  02/25/2016  Time of notification:  0537  Critical value read back:Yes.    Nurse who received alert:  [place] Ireoluwa Gorsline, RN   MD notified (1st page):  Jamison NeighborNestor CCM  Time of first page:  [phone]  MD notified (2nd page):  Time of second page:  Responding MD:  Jamison NeighborNestor  Time MD responded:  [phone]- No new orders received, continue to monitor. Analynn Daum, [place] Marie 6:17 AM

## 2016-02-25 NOTE — Clinical Social Work Note (Signed)
Clinical Social Work Assessment  Patient Details  Name: Kenneth Obrien MRN: [id number] Date of Birth: 14-Jun-1951  Date of referral:  02/25/16               Reason for consult:  Discharge Planning, Facility Placement                Permission sought to share information with:  Facility Sport and exercise psychologist, Family Supports Permission granted to share information::  Yes, Verbal Permission Granted  Name::     Insurance account manager::  SNFs  Relationship::     Contact Information:     Housing/Transportation Living arrangements for the past 2 months:  Single Family Home Source of Information:  Patient Patient Interpreter Needed:  None Criminal Activity/Legal Involvement Pertinent to Current Situation/Hospitalization:  No - Comment as needed Significant Relationships:  Siblings, Other Family Members Lives with:  Self Do you feel safe going back to the place where you live?  Yes Need for family participation in patient care:  Yes (Comment)  Care giving concerns:  The patient states that he was hoping he would be well enough to return home at discharge, but realizes that he may need to consider SNF placement in case he is not well enough to return home at time of DC.   Social Worker assessment / plan:  CSW met with patient at bedside to complete assessment. The patient reports that he lives alone at home but does have a supportive family. He specifically states that if he returns home, his nephew would be able to stay with him. The patient does share that he is agreeable to SNF placement if this is needed at the time of DC. The patient states that his preference would be for [facility]. CSW explained the SNF search/placement process and answered the patient's questions. CSW will followup with available bed offers.  Employment status:  Disabled (Comment on whether or not currently receiving Disability) Insurance information:    PT Recommendations:  [place] / Referral to community resources:  [place]  Patient/Family's Response to care:  The patient states that he is appreciative of the assistance of CSW and is happy with the care he has received.  Patient/Family's Understanding of and Emotional Response to Diagnosis, Current Treatment, and Prognosis:  The patient appears to have a good understanding of the reason for his admission. The patient understands that he will likely need rehab at discharge.  Emotional Assessment Appearance:  Appears stated age Attitude/Demeanor/Rapport:  Other (The patient is appropriate and welcoming of CSW.) Affect (typically observed):  Accepting, Calm, Appropriate, Pleasant Orientation:  Oriented to Self, Oriented to Place, Oriented to  Time, Oriented to Situation Alcohol / Substance use:  Not Applicable Psych involvement (Current and /or in the community):  No (Comment)  Discharge Needs  Concerns to be addressed:  Discharge Planning Concerns Readmission within the last 30 days:  No Current discharge risk:  Physical Impairment Barriers to Discharge:  Continued Medical Work up   Fredderick Phenix B, LCSW 02/25/2016, 12:00 PM

## 2016-02-26 LAB — TYPE AND SCREEN
ABO/RH(D): B POS
ANTIBODY SCREEN: NEGATIVE
UNIT DIVISION: 0
Unit division: 0

## 2016-02-26 LAB — PREPARE FRESH FROZEN PLASMA
UNIT DIVISION: 0
Unit division: 0

## 2016-02-26 LAB — CBC
HEMATOCRIT: 21.2 % — AB (ref 39.0–52.0)
HEMOGLOBIN: 7.3 g/dL — AB (ref 13.0–17.0)
MCH: 31.9 pg (ref 26.0–34.0)
MCHC: 34.4 g/dL (ref 30.0–36.0)
MCV: 92.6 fL (ref 78.0–100.0)
PLATELETS: 62 10*3/uL — AB (ref 150–400)
RBC: 2.29 MIL/uL — AB (ref 4.22–5.81)
RDW: 17.8 % — ABNORMAL HIGH (ref 11.5–15.5)
WBC: 9.3 10*3/uL (ref 4.0–10.5)

## 2016-02-26 LAB — PREPARE PLATELET PHERESIS: Unit division: 0

## 2016-02-26 MED ORDER — ZOLPIDEM TARTRATE 5 MG PO TABS
10.0000 mg | ORAL_TABLET | Freq: Every day | ORAL | Status: DC
  Administered 2016-02-26 – 2016-02-29 (×4): 10 mg via ORAL
  Filled 2016-02-26 (×4): qty 2

## 2016-02-26 NOTE — Progress Notes (Addendum)
Physical Therapy Treatment Patient Details Name: Kenneth MarketStephen C Obrien MRN: [id number] DOB: 09-16-1951 Today's Date: 02/26/2016    History of Present Illness 65 yo male s/p L4-5 L5-s1 laminectomy PMH: HTN chronic insomina COPD anxiety GERD arthritis hepatitis diverticulitis cirrhosis    PT Comments    Patient seen for mobility, tolerated slightly increased distance today. Less physical assist with transfers. Performed sit<>stand several times during session. Continues to remain limited with function and mobility. Will continue to see and progress as tolerated.   Follow Up Recommendations  SNF;Supervision/Assistance - 24 hour     Equipment Recommendations  Rolling walker with 5" wheels    Recommendations for Other Services       Precautions / Restrictions Precautions Precautions: Back;Fall Precaution Comments: handout provided and reviewed in detail Required Braces or Orthoses: Spinal Brace Spinal Brace: Lumbar corset;Applied in sitting position Restrictions Weight Bearing Restrictions: No    Mobility  Bed Mobility Overal bed mobility: Needs Assistance Bed Mobility: Sit to Supine Rolling: Min guard Sidelying to sit: Mod assist       General bed mobility comments: VCs for positioning and technique, assist to elevate trunk to upright positioning  Transfers Overall transfer level: Needs assistance Equipment used: Rolling walker (2 wheeled) Transfers: Sit to/from Stand Sit to Stand: Min assist         General transfer comment: min assist to elevate to standing with use of RW. VCs for hand placement and posture, increased time to perform. Performed several times during session.  Ambulation/Gait Ambulation/Gait assistance: Min assist Ambulation Distance (Feet): 50 Feet Assistive device: Rolling walker (2 wheeled) Gait Pattern/deviations: Step-through pattern;Decreased stride length;Decreased dorsiflexion - right;Decreased dorsiflexion - left;Shuffle Gait velocity:  decreased Gait velocity interpretation: <1.8 ft/sec, indicative of risk for recurrent falls General Gait Details: patient continues to have limited activity tolerance with mobility, requires assist for stability and max cues for stride and posture with use of rw   Stairs            Wheelchair Mobility    Modified Rankin (Stroke Patients Only)       Balance     Sitting balance-Leahy Scale: Fair     Standing balance support: During functional activity Standing balance-Leahy Scale: Poor                      Cognition Arousal/Alertness: Awake/alert Behavior During Therapy: WFL for tasks assessed/performed Overall Cognitive Status: Within Functional Limits for tasks assessed                      Exercises      General Comments General comments (skin integrity, edema, etc.): session limited by patient with some urinary incontinence when coming to standing, assist required for hygiene and pericare.      Pertinent Vitals/Pain Pain Assessment: 0-10 Pain Score: 4  Pain Location: back Pain Descriptors / Indicators: Sore Pain Intervention(s): Monitored during session;Repositioned    Home Living                      Prior Function            PT Goals (current goals can now be found in the care plan section) Acute Rehab PT Goals Patient Stated Goal: none stated PT Goal Formulation: With patient/family Time For Goal Achievement: 03/09/16 Potential to Achieve Goals: Good Progress towards PT goals: Progressing toward goals    Frequency  Min 5X/week    PT Plan Current plan remains appropriate  Co-evaluation             End of Session Equipment Utilized During Treatment: Gait belt;Back brace Activity Tolerance: Patient limited by fatigue Patient left: in chair;with call bell/phone within reach;with chair alarm set     Time: 610 044 [id number] PT Time Calculation (min) (ACUTE ONLY): 23 min  Charges:  $Gait Training: 8-22  mins $Therapeutic Activity: 8-22 mins                    G CodesFabio Asa 23-Mar-2016, 10:50 AM Charlotte Crumb, PT DPT  [phone]

## 2016-02-26 NOTE — Progress Notes (Signed)
Patient slightly dizzy when standing but improves. BP 90s/60s standing. Tolerating fair overall. Patient also unable to control urine when standing and unable to empty bladder. MD aware of all. Plan to transfer to floor with threshold of 300cc by bladder scan for foley placement. Patient has required multiple I&O caths yesterday and overnight. Will continue to monitor.

## 2016-02-26 NOTE — Care Management Important Message (Signed)
Important Message  Patient Details  Name: Kenneth Obrien MRN: [id number] Date of Birth: [birth year]   Medicare Important Message Given:  Yes    Kenneth Obrien Abena 02/26/2016, 1:02 PM

## 2016-02-26 NOTE — Progress Notes (Signed)
Patient ID: Kenneth MarketStephen C Ellington, male   DOB: 08/18/1951, 65 y.o.   MRN: [id number] Vital signs are stable. Even with getting out of bed and standing patient's heart rate hovers around 100 blood pressure is maintained Voiding is still a problem for him Hemoglobin is 6.2 this morning after transfusion yesterday of 1 unit packed cells 2 units FFP and pheresis of platelets Hemovac drainage has decreased substantially to less than 50 mL Hemovac removed and dressing changed. Incision is clean and dry. We'll transfer to floor Nearing readiness for rehabilitation or SNF

## 2016-02-26 NOTE — Progress Notes (Signed)
Rehab admissions - Evaluated for possible admission.  Patient has [employer] medicare and it is very unlikely that we could get approval for acute inpatient rehab admission.  Noted PT now recommending SNF.  Please see rehab consult done by Dr. Allena KatzPatel on 02/25/16.  Recommend pursuit of SNF placement.  Call me for questions.  #[phone]

## 2016-02-26 NOTE — Progress Notes (Signed)
PULMONARY / CRITICAL CARE MEDICINE   Name: Kenneth Obrien MRN: [id number] DOB: [birth year]    ADMISSION DATE:  02/23/2016 CONSULTATION DATE:  02/23/2016  Chart reviewed, bleeding stopped.   PCCM will sign off  Heber CarolinaBrent Iliyana Convey, MD [place] [facility] Pager: [phone] Cell: [phone] After 3pm or if no response, call [phone]

## 2016-02-27 MED ORDER — POLYETHYLENE GLYCOL 3350 17 G PO PACK
17.0000 g | PACK | Freq: Every day | ORAL | Status: DC
  Administered 2016-02-27 – 2016-02-28 (×2): 17 g via ORAL
  Filled 2016-02-27 (×2): qty 1

## 2016-02-27 NOTE — Progress Notes (Signed)
Patient ID: Nyra MarketStephen C Andonian, male   DOB: [birth year], 65 y.o.   MRN: [id number] Vital signs are stable Asian continues to have voiding difficulties Foley is been placed I will ask urology to see Patient will need skilled nursing facility for a period of time He does not that he is having less leg pain Is continuing his ambulation with assistance

## 2016-02-27 NOTE — Progress Notes (Signed)
Physical Therapy Treatment Patient Details Name: Kenneth Obrien MRN: [id number] DOB: 1951-08-06 Today's Date: 02/27/2016    History of Present Illness 65 yo male s/p L4-5 L5-s1 laminectomy PMH: HTN chronic insomina COPD anxiety GERD arthritis hepatitis diverticulitis cirrhosis    PT Comments    Pt progressing towards physical therapy goals. Was able to perform transfers and ambulation with min assist for balance support and safety. Chair follow utilized for 2 seated rest breaks during gait training. Pt was able to make temporary corrective changes for narrow BOS and heel/toe pattern but was not able to maintain. Will continue to follow and progress as able per POC.   Follow Up Recommendations  SNF;Supervision/Assistance - 24 hour     Equipment Recommendations  Rolling walker with 5" wheels    Recommendations for Other Services       Precautions / Restrictions Precautions Precautions: Back;Fall Precaution Comments: handout reviewed and pt was cued for precautions during functional mobility.  Required Braces or Orthoses: Spinal Brace Spinal Brace: Lumbar corset;Applied in sitting position Restrictions Weight Bearing Restrictions: No    Mobility  Bed Mobility               General bed mobility comments: Pt was received sitting up in the recliner.   Transfers Overall transfer level: Needs assistance Equipment used: Rolling walker (2 wheeled) Transfers: Sit to/from Stand Sit to Stand: Mod assist;Min assist         General transfer comment: Mod assist initially progressing to min assist by end of session. Pt completed x3.  Ambulation/Gait Ambulation/Gait assistance: Min assist;+2 safety/equipment Ambulation Distance (Feet): 80 Feet Assistive device: Rolling walker (2 wheeled) Gait Pattern/deviations: Step-through pattern;Decreased stride length;Narrow base of support Gait velocity: decreased Gait velocity interpretation: Below normal speed for age/gender General  Gait Details: Chair follow utilized and pt took x2 seated rest breaks. Heavy min assist required at times due to unsteadiness and LOB. Pt demonstrating narrow BOS and occasional heel/toe pattern.    Stairs            Wheelchair Mobility    Modified Rankin (Stroke Patients Only)       Balance Overall balance assessment: Needs assistance Sitting-balance support: Feet supported;No upper extremity supported Sitting balance-Leahy Scale: Fair     Standing balance support: Bilateral upper extremity supported;During functional activity Standing balance-Leahy Scale: Poor                      Cognition Arousal/Alertness: Awake/alert Behavior During Therapy: WFL for tasks assessed/performed Overall Cognitive Status: Within Functional Limits for tasks assessed                      Exercises      General Comments        Pertinent Vitals/Pain Pain Assessment: Faces Faces Pain Scale: Hurts a little bit Pain Location: Incision site Pain Descriptors / Indicators: Operative site guarding;Sore Pain Intervention(s): Limited activity within patient's tolerance;Monitored during session;Repositioned    Home Living                      Prior Function            PT Goals (current goals can now be found in the care plan section) Acute Rehab PT Goals Patient Stated Goal: Walk better and go home PT Goal Formulation: With patient/family Time For Goal Achievement: 03/09/16 Potential to Achieve Goals: Good Progress towards PT goals: Progressing toward goals    Frequency  Min 5X/week    PT Plan Current plan remains appropriate    Co-evaluation             End of Session Equipment Utilized During Treatment: Gait belt;Back brace Activity Tolerance: Patient limited by fatigue Patient left: in chair;with call bell/phone within reach;with chair alarm set     Time: [id number] PT Time Calculation (min) (ACUTE ONLY): 23 min  Charges:  $Gait  Training: 23-37 mins                    G Codes:      Conni SlipperKirkman, Ciena Sampley 02/27/2016, 12:29 PM   Conni SlipperLaura Sudiksha Victor, PT, DPT Acute Rehabilitation Services Pager: [phone]

## 2016-02-27 NOTE — Consult Note (Signed)
Urology Consult  Referring physician: Dr. Ellene Route  Reason for referral: Urinary retention  Chief Complaint: urinary urgency  History of Present Illness: [name] with a hx of BPH with LUTS, and spinal stenosis who has had difficulty urinating post laminectomy. Prior to surgery he had nocturia 1-2x, urgency, frequency, dribbling, weak stream, and incomplete emptying. He was not on any BPH treatment prior to surgery. Since surgery he has required CIC and now currently has an indwelling foley for urinary retention. He was started on flomax 2 days ago. The patient has been having issues with constipation since surgery. He has had intermittent gross hematuria since the foley was placed. Platelets are 62.   Past Medical History  Diagnosis Date  . Hypertension   . Chronic insomnia   . COPD (chronic obstructive pulmonary disease) ([place])   . Anxiety   . GERD (gastroesophageal reflux disease)     takes OTC  . Arthritis   . Hepatitis     had hep c   took tx ...last one in Dec. 2016  . Diverticulitis   . Cirrhosis of liver [facility])    Past Surgical History  Procedure Laterality Date  . Partial colectomy      took 19 inches in 1998 for diverticulitis  . Colon surgery    . Broken left ankle      no surgery done    Medications: I have reviewed the patient's current medications. Allergies: No Known Allergies  History reviewed. No pertinent family history. Social History:  reports that he has been smoking Cigarettes.  He has a 20 pack-year smoking history. He does not have any smokeless tobacco history on file. He reports that he does not drink alcohol or use illicit drugs.  Review of Systems  Genitourinary: Positive for urgency, frequency and hematuria.  All other systems reviewed and are negative.   Physical Exam:  Vital signs in last 24 hours: Temp:  [98.5 F (36.9 C)-100.4 F (38 C)] 98.5 F (36.9 C) (05/05 1759) Pulse Rate:  [79-102] 102 (05/05 1759) Resp:  [18-20] 18  (05/05 1759) BP: (110-130)/(55-71) 130/61 mmHg (05/05 1759) SpO2:  [95 %-99 %] 99 % (05/05 1759) Physical Exam  Constitutional: He is oriented to person, place, and time. He appears well-developed and well-nourished.  HENT:  Head: Normocephalic and atraumatic.  Eyes: EOM are normal. Pupils are equal, round, and reactive to light.  Neck: Normal range of motion. No thyromegaly present.  Cardiovascular: Normal rate and regular rhythm.   Respiratory: Effort normal. No respiratory distress.  GI: Soft. He exhibits no distension and no mass. There is no tenderness. There is no rebound and no guarding. Hernia confirmed negative in the left inguinal area.  Genitourinary: Rectum normal, testes normal and penis normal. Rectal exam shows anal tone normal. Prostate is enlarged. Prostate is not tender. Right testis shows no mass and no tenderness. Left testis shows no mass and no tenderness. Circumcised.  Musculoskeletal: Normal range of motion.  Lymphadenopathy:       Right: No inguinal adenopathy present.       Left: No inguinal adenopathy present.  Neurological: He is alert and oriented to person, place, and time.  Skin: Skin is warm and dry.  Psychiatric: He has a normal mood and affect. His behavior is normal. Judgment and thought content normal.    Laboratory Data:  Results for orders placed or performed during the hospital encounter of 02/23/16 (from the past 72 hour(s))  CBC  Status: Abnormal   Collection Time: 02/24/16  9:10 PM  Result Value Ref Range   WBC 10.9 (H) 4.0 - 10.5 K/uL   RBC 2.18 (L) 4.22 - 5.81 MIL/uL   Hemoglobin 7.0 (L) 13.0 - 17.0 g/dL   HCT 20.0 (L) 39.0 - 52.0 %   MCV 91.7 78.0 - 100.0 fL   MCH 32.1 26.0 - 34.0 pg   MCHC 35.0 30.0 - 36.0 g/dL   RDW 18.3 (H) 11.5 - 15.5 %   Platelets 44 (L) 150 - 400 K/uL    Comment: CONSISTENT WITH PREVIOUS RESULT  CBC     Status: Abnormal   Collection Time: 02/25/16  1:12 AM  Result Value Ref Range   WBC 13.2 (H) 4.0 - 10.5  K/uL   RBC 2.20 (L) 4.22 - 5.81 MIL/uL   Hemoglobin 7.2 (L) 13.0 - 17.0 g/dL   HCT 20.3 (L) 39.0 - 52.0 %   MCV 92.3 78.0 - 100.0 fL   MCH 32.7 26.0 - 34.0 pg   MCHC 35.5 30.0 - 36.0 g/dL   RDW 18.3 (H) 11.5 - 15.5 %   Platelets 50 (L) 150 - 400 K/uL    Comment: CONSISTENT WITH PREVIOUS RESULT  Basic metabolic panel     Status: Abnormal   Collection Time: 02/25/16  5:15 AM  Result Value Ref Range   Sodium 138 135 - 145 mmol/L   Potassium 4.6 3.5 - 5.1 mmol/L   Chloride 108 101 - 111 mmol/L   CO2 25 22 - 32 mmol/L   Glucose, Bld 132 (H) 65 - 99 mg/dL   BUN 10 6 - 20 mg/dL   Creatinine, Ser 0.77 0.61 - 1.24 mg/dL   Calcium 7.6 (L) 8.9 - 10.3 mg/dL   GFR calc non Af Amer >60 >60 mL/min   GFR calc Af Amer >60 >60 mL/min    Comment: (NOTE) The eGFR has been calculated using the CKD EPI equation. This calculation has not been validated in all clinical situations. eGFR's persistently <60 mL/min signify possible Chronic Kidney Disease.    Anion gap 5 5 - 15  CBC     Status: Abnormal   Collection Time: 02/25/16  5:15 AM  Result Value Ref Range   WBC 10.4 4.0 - 10.5 K/uL   RBC 2.10 (L) 4.22 - 5.81 MIL/uL   Hemoglobin 6.8 (LL) 13.0 - 17.0 g/dL    Comment: REPEATED TO VERIFY CRITICAL RESULT CALLED TO, READ BACK BY AND VERIFIED WITH: J ALICE,RN 03/31/66 2094 RHOLMES    HCT 19.4 (L) 39.0 - 52.0 %   MCV 92.4 78.0 - 100.0 fL   MCH 32.4 26.0 - 34.0 pg   MCHC 35.1 30.0 - 36.0 g/dL   RDW 18.3 (H) 11.5 - 15.5 %   Platelets 41 (L) 150 - 400 K/uL    Comment: CONSISTENT WITH PREVIOUS RESULT  Prepare fresh frozen plasma     Status: None   Collection Time: 02/25/16  8:46 AM  Result Value Ref Range   Unit Number [id number]    Blood Component Type THAWED PLASMA    Unit division 00    Status of Unit ISSUED,FINAL    Transfusion Status OK TO TRANSFUSE    Unit Number [id number]    Blood Component Type THAWED PLASMA    Unit division 00    Status of Unit ISSUED,FINAL    Transfusion  Status OK TO TRANSFUSE   Prepare Pheresed Platelets     Status: None   Collection Time:  02/25/16  8:47 AM  Result Value Ref Range   Unit Number [id number]    Blood Component Type PLTP LR1 PAS    Unit division 00    Status of Unit ISSUED,FINAL    Transfusion Status OK TO TRANSFUSE   Prepare RBC     Status: None   Collection Time: 02/25/16  8:48 AM  Result Value Ref Range   Order Confirmation ORDER PROCESSED BY BLOOD BANK   CBC     Status: Abnormal   Collection Time: 02/25/16  2:37 PM  Result Value Ref Range   WBC 13.9 (H) 4.0 - 10.5 K/uL   RBC 2.43 (L) 4.22 - 5.81 MIL/uL   Hemoglobin 7.8 (L) 13.0 - 17.0 g/dL   HCT 22.5 (L) 39.0 - 52.0 %   MCV 92.6 78.0 - 100.0 fL   MCH 32.1 26.0 - 34.0 pg   MCHC 34.7 30.0 - 36.0 g/dL   RDW 17.7 (H) 11.5 - 15.5 %   Platelets 55 (L) 150 - 400 K/uL    Comment: REPEATED TO VERIFY SPECIMEN CHECKED FOR CLOTS CONSISTENT WITH PREVIOUS RESULT   CBC     Status: Abnormal   Collection Time: 02/26/16  3:17 AM  Result Value Ref Range   WBC 9.3 4.0 - 10.5 K/uL   RBC 2.29 (L) 4.22 - 5.81 MIL/uL   Hemoglobin 7.3 (L) 13.0 - 17.0 g/dL   HCT 21.2 (L) 39.0 - 52.0 %   MCV 92.6 78.0 - 100.0 fL   MCH 31.9 26.0 - 34.0 pg   MCHC 34.4 30.0 - 36.0 g/dL   RDW 17.8 (H) 11.5 - 15.5 %   Platelets 62 (L) 150 - 400 K/uL    Comment: CONSISTENT WITH PREVIOUS RESULT   No results found for this or any previous visit (from the past 240 hour(s)). Creatinine:  Recent Labs  02/23/16 1643 02/25/16 0515  CREATININE 1.03 0.77   Baseline Creatinine: 0.9  Impression/Assessment:  64yo with BPH with LUTS, urinary retention, gross hematuria  Plan:  1. The patients urinary retention is likely a combination of BPH, constipation, and narcotic pain medication. Please continue indwelling foley and continue flomax 0.11m daily. The patient should be discharged on flomax and followup in 1 week after discharge for a voiding trial. 2. Gross hematuria: The patients hematuria is  likely due to irritation from the foley catheter and his low platelet count. If the hematuria persists then we will proceed with hematuria workup as an outpatient.  PNicolette Bang5/02/2016, 8:35 PM

## 2016-02-27 NOTE — Care Management Note (Signed)
Case Management Note  Patient Details  Name: Kenneth Obrien MRN: [id number] Date of Birth: [birth year]  Subjective/Objective:                    Action/Plan: Rec is for SNF. CM continuing to follow for d/c needs.   Expected Discharge Date:                  Expected Discharge Plan:  Skilled Nursing Facility  In-House Referral:  Clinical Social Work  Discharge planning Services  CM Consult  Post Acute Care Choice:    Choice offered to:     DME Arranged:    DME Agency:     HH Arranged:    HH Agency:     Status of Service:  In process, will continue to follow  Medicare Important Message Given:  Yes Date Medicare IM Given:    Medicare IM give by:    Date Additional Medicare IM Given:    Additional Medicare Important Message give by:     If discussed at Long Length of Stay Meetings, dates discussed:    Additional Comments:  [name], RN 02/27/2016, 2:27 PM

## 2016-02-28 NOTE — Progress Notes (Signed)
Chart reviewed today. No DC indicated today. Bed available at [facility] which has a [employer] auth in place.  SW services will continue to monitor and assist with d/c when stable.  Lorri Frederickonna T. Chondra Boyde, LCSW [phone] (weekend coverage)

## 2016-02-28 NOTE — Progress Notes (Signed)
Filed Vitals:   02/27/16 1759 02/27/16 2140 02/28/16 0208 02/28/16 0545  BP: 130/61 128/62 121/78 110/59  Pulse: 102 86 89 85  Temp: 98.5 F (36.9 C) 99.5 F (37.5 C) 98.4 F (36.9 C) 100.2 F (37.9 C)  TempSrc: Oral Oral Oral Oral  Resp: 18 18 18 18   Height:      Weight:      SpO2: 99% 99% 98% 98%    CBC  Recent Labs  02/25/16 1437 02/26/16 0317  WBC 13.9* 9.3  HGB 7.8* 7.3*  HCT 22.5* 21.2*  PLT 55* 62*    Patient resting in bed. Reasonably comfortable. Spoke with patient about progressing mobility, donning and doffing lumbar brace when standing, and increasing ambulation in the halls.  Plan: Continue to progress through postoperative recovery.  Hewitt ShortsNUDELMAN,ROBERT W, MD 02/28/2016, 9:03 AM

## 2016-02-29 NOTE — Progress Notes (Signed)
No issues overnight. Cont to report slow improvement in back soreness. Reports "100%" improvement in preop leg symptoms.  EXAM:  BP 103/46 mmHg  Pulse 95  Temp(Src) 98.7 F (37.1 C) (Oral)  Resp 20  Ht 6' (1.829 m)  Wt 89.812 kg (198 lb)  BMI 26.85 kg/m2  SpO2 98%  Awake, alert, oriented  Speech fluent, appropriate  CN grossly intact  Good strength BLE  IMPRESSION:  65 y.o. male s/p L4-S1 PLIF, recovering well.  Urinary retention  PLAN: - Will need SNF placement - Cont PT/OT - Maintain Foley per Urology recs.

## 2016-03-01 MED ORDER — OXYCODONE HCL 15 MG PO TABS: 15.0000 mg | ORAL_TABLET | Freq: Three times a day (TID) | ORAL | Status: AC | PRN

## 2016-03-01 MED ORDER — METHOCARBAMOL 500 MG PO TABS: 500.0000 mg | ORAL_TABLET | Freq: Four times a day (QID) | ORAL | Status: DC | PRN

## 2016-03-01 NOTE — Discharge Summary (Signed)
Physician Discharge Summary  Patient ID: Kenneth Obrien MRN: [id number] DOB/AGE: 1951-07-12 65 y.o.  Admit date: 02/23/2016 Discharge date: 03/01/2016  Admission Diagnoses:Lumbar spondylosis and stenosis with neurogenic claudication, lumbar radiculopathy. End-stage liver disease. Thrombocytopenia. Coagulopathy.  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 Lumbar radiculopathy. End-stage liver disease area thrombocytopenia. Coagulopathy. Urinary retention. Benign prostatic hypertrophy. Chronic obstructive pulmonary disease. Acute blood loss anemia. Active Problems:   Back injuries   Lumbar stenosis with neurogenic claudication   Back pain   Essential hypertension   Chronic obstructive pulmonary disease (HCC)   OSA (obstructive sleep apnea)   Cirrhosis of liver without ascites (HCC)   Chronic hepatitis C without hepatic coma (HCC)   Peripheral neuropathy (HCC)   Respiratory depression   Leukocytosis   Thrombocytopenia (HCC)   Acute blood loss anemia   Discharged Condition: fair  Hospital Course: Patient is a 65 year old male who's had this severe hepatitis C that has been treated. Nonetheless his hepatic function is very limited. He has thrombocytopenia. He has a chronic low-grade coagulopathy with an INR of 1.7. He has severe spondylitic stenosis in the lower lumbar spine and he was admitted for surgical decompression and stabilization at L4-5 and L5-S1. He tolerated the surgery well. He has surgical drains placed which continued to produce drainage for the first 3 days postoperatively. This led to an acute blood loss anemia and he required additional 3 units of blood transfusion. He also required fresh frozen plasma and platelet transfusions. Nonetheless his condition has been stabilized. He cannot void spontaneously other than dribbling from overflow incontinence and has a high urinary residual. It was recommended that he have a Foley catheter placed  for at least a week's time and thereafter have a voiding trial. He was seen in consultation by Dr. Cleda MccreedyPatrick Mackenzie from urology at [facility]. At [phone]. They will be following up regarding the Foley catheter. He'll be seen as an outpatient for further neurosurgical follow-up in 3 weeks' time. Prescriptions for his pain medications oxycodone 15 and Robaxin have been written. These can be discontinued as he tolerates.  Consults: urology, critical care medicine, triad hospitalists, rehabilitation medicine  Significant Diagnostic Studies: None  Treatments: surgery: Decompression via laminectomy L4-5 and L5-S1. Posterior lumbar interbody arthrodesis L4 to sacrum  Discharge Exam: Blood pressure 113/60, pulse 83, temperature 99 F (37.2 C), temperature source Oral, resp. rate 16, height 6' (1.829 m), weight 89.812 kg (198 lb), SpO2 99 %. Incision is clean and dry. Station reveals a moderately wide base his gait is moderately unstable requiring the use of a walker  Disposition: Skilled nursing facility, [facility]  Discharge Instructions    Call MD for:  redness, tenderness, or signs of infection (pain, swelling, redness, odor or green/yellow discharge around incision site)    Complete by:  As directed      Call MD for:  severe uncontrolled pain    Complete by:  As directed      Call MD for:  temperature >100.4    Complete by:  As directed      Diet - low sodium heart healthy    Complete by:  As directed      Discharge instructions    Complete by:  As directed   Contact [facility] for follow up care regarding foley catheter     Increase activity slowly    Complete by:  As directed             Medication List  TAKE these medications        ALPRAZolam 0.5 MG tablet  Commonly known as:  XANAX  Take 0.5 mg by mouth daily.     carvedilol 3.125 MG tablet  Commonly known as:  COREG  Take 1.5625 mg by mouth 2 (two) times daily with a meal.     methocarbamol 500 MG  tablet  Commonly known as:  ROBAXIN  Take 1 tablet (500 mg total) by mouth every 6 (six) hours as needed for muscle spasms.     Oxycodone HCl 20 MG Tabs  Take 20 mg by mouth 3 (three) times daily as needed (for pain).     oxyCODONE 15 MG immediate release tablet  Commonly known as:  ROXICODONE  Take 1 tablet (15 mg total) by mouth 3 (three) times daily as needed for moderate pain.     PAIN RELIEVER/SLEEP AID PO  Take 0.5 tablets by mouth at bedtime.     zolpidem 10 MG tablet  Commonly known as:  AMBIEN  Take 10 mg by mouth at bedtime.           Follow-up Information    Follow up with [facility] .   Specialty:  Skilled Nursing Facility   Contact information:   [address] [place] [address] [phone]      Signed: Stefani Dama 03/01/2016, 1:35 PM

## 2016-03-01 NOTE — Progress Notes (Signed)
Patient will DC to: [facility] Anticipated DC date: 03/01/16 Family notified: Sister Transport by: Sharin MonsPTAR   Per MD patient ready for DC to [facility]. RN, patient, patient's family, and facility notified of DC. RN given number for report. DC packet on chart. Ambulance transport requested for patient.   CSW signing off.  Cristobal GoldmannNadia Tenaya Hilyer, [place]CSWA Clinical Social Worker [phone]

## 2016-03-01 NOTE — Progress Notes (Signed)
Report called to RN at [facility]. Patient ready for discharge to facility.

## 2016-03-01 NOTE — Progress Notes (Signed)
Discharge orders received. Pt for discharge to [facility]. Report given by day shift RN. IV D/C'd, dressing CDI to lower back. Prescription and D/C instructions given to PTAR with verbalized understanding by Pt. Pt boarded onto stretcher by RN and PTAR x 2. Delfino Lovettichie Sheray Grist, RN, BSN 03/01/2016 8:43 PM

## 2016-03-01 NOTE — Progress Notes (Signed)
Physical Therapy Treatment Patient Details Name: Kenneth Obrien MRN: [id number] DOB: [birth year] Today's Date: 03/01/2016    History of Present Illness 65 yo male s/p L4-5 L5-s1 laminectomy PMH: HTN chronic insomina COPD anxiety GERD arthritis hepatitis diverticulitis cirrhosis    PT Comments    Pt progressing towards physical therapy goals. Was able to perform transfers and ambulation with min guard to min assist for balance support and safety. Pt motivated for distance however continued to required seated rest breaks and cues for technique. Will continue to follow and progress as able per POC.   Follow Up Recommendations  SNF;Supervision/Assistance - 24 hour     Equipment Recommendations  Rolling walker with 5" wheels    Recommendations for Other Services       Precautions / Restrictions Precautions Precautions: Back;Fall Precaution Comments: Reviewed precautions during functional mobility Required Braces or Orthoses: Spinal Brace Spinal Brace: Lumbar corset;Applied in sitting position Restrictions Weight Bearing Restrictions: No    Mobility  Bed Mobility Overal bed mobility: Needs Assistance Bed Mobility: Rolling;Sidelying to Sit Rolling: Supervision Sidelying to sit: Min guard       General bed mobility comments: VC's for sequencing/technique for proper log roll. No physical assist required however close guard for safety was provided for trunk elevation.   Transfers Overall transfer level: Needs assistance Equipment used: Rolling walker (2 wheeled) Transfers: Sit to/from Stand Sit to Stand: Min assist         General transfer comment: VC's for hand placement on seated surface for safety.   Ambulation/Gait Ambulation/Gait assistance: Min assist Ambulation Distance (Feet): 160 Feet Assistive device: Rolling walker (2 wheeled) Gait Pattern/deviations: Step-through pattern;Decreased stride length;Trunk flexed Gait velocity: decreased Gait velocity  interpretation: Below normal speed for age/gender General Gait Details: Chair follow utilized and pt took x4 seated rest breaks. Heavy min assist required at times due to unsteadiness and LOB.   Stairs            Wheelchair Mobility    Modified Rankin (Stroke Patients Only)       Balance Overall balance assessment: Needs assistance Sitting-balance support: Feet supported;No upper extremity supported Sitting balance-Leahy Scale: Fair     Standing balance support: Bilateral upper extremity supported;During functional activity Standing balance-Leahy Scale: Poor                      Cognition Arousal/Alertness: Awake/alert Behavior During Therapy: WFL for tasks assessed/performed Overall Cognitive Status: Within Functional Limits for tasks assessed                      Exercises      General Comments        Pertinent Vitals/Pain Pain Assessment: Faces Faces Pain Scale: Hurts a little bit Pain Location: Back/incision Pain Descriptors / Indicators: Operative site guarding;Sore Pain Intervention(s): Limited activity within patient's tolerance;Monitored during session;Repositioned    Home Living                      Prior Function            PT Goals (current goals can now be found in the care plan section) Acute Rehab PT Goals Patient Stated Goal: Walk better and go home PT Goal Formulation: With patient/family Time For Goal Achievement: 03/09/16 Potential to Achieve Goals: Good Progress towards PT goals: Progressing toward goals    Frequency  Min 5X/week    PT Plan Current plan remains appropriate    Co-evaluation  End of Session Equipment Utilized During Treatment: Gait belt;Back brace Activity Tolerance: Patient limited by fatigue Patient left: in chair;with call bell/phone within reach;with chair alarm set     Time: [id number] PT Time Calculation (min) (ACUTE ONLY): 32 min  Charges:  $Gait Training:  23-37 mins                    G Codes:      Conni SlipperKirkman, Prescious Hurless 03/01/2016, 2:20 PM   Conni SlipperLaura Zafiro Routson, PT, DPT Acute Rehabilitation Services Pager: [phone]

## 2016-03-01 NOTE — Care Management Note (Signed)
Case Management Note  Patient Details  Name: Kenneth Obrien MRN: [id number] Date of Birth: August 04, 1951  Subjective/Objective:                    Action/Plan: Patient to [facility] today. No further needs per CM.   Expected Discharge Date:                  Expected Discharge Plan:  Skilled Nursing Facility  In-House Referral:  Clinical Social Work  Discharge planning Services  CM Consult  Post Acute Care Choice:    Choice offered to:     DME Arranged:    DME Agency:     HH Arranged:    HH Agency:     Status of Service:  In process, will continue to follow  Medicare Important Message Given:  Yes Date Medicare IM Given:    Medicare IM give by:    Date Additional Medicare IM Given:    Additional Medicare Important Message give by:     If discussed at Long Length of Stay Meetings, dates discussed:    Additional Comments:  [name], RN 03/01/2016, 2:10 PM

## 2016-03-01 NOTE — Clinical Social Work Placement (Signed)
   CLINICAL SOCIAL WORK PLACEMENT  NOTE  Date:  03/01/2016  Patient Details  Name: Kenneth Obrien MRN: [id number] Date of Birth: 1951-04-23  Clinical Social Work is seeking post-discharge placement for this patient at the Skilled  Nursing Facility level of care (*CSW will initial, date and re-position this form in  chart as items are completed):  Yes   Patient/family provided with [place] Clinical Social Work Department's list of facilities offering this level of care within the geographic area requested by the patient (or if unable, by the patient's family).  Yes   Patient/family informed of their freedom to choose among providers that offer the needed level of care, that participate in Medicare, Medicaid or managed care program needed by the patient, have an available bed and are willing to accept the patient.  Yes   Patient/family informed of [facility]'s ownership interest in [facility] and [facility], as well as of the fact that they are under no obligation to receive care at these facilities.  PASRR submitted to [name] on 02/25/16     PASRR number received on 02/25/16     Existing PASRR number confirmed on       FL2 transmitted to all facilities in geographic area requested by pt/family on 02/25/16     FL2 transmitted to all facilities within larger geographic area on       Patient informed that his/her managed care company has contracts with or will negotiate with certain facilities, including the following:        Yes   Patient/family informed of bed offers received.  [name]     Physician recommends and patient chooses bed at      [name] 03[facility]16.  Patient to be transferred to facility by PTAR     Patient family notified on 03/01/16 of transfer.  Name of family member notified:  [name]     PHYSICIAN Please prepare priority discharge summary, including  medications, Please prepare prescriptions, Please sign FL2     Additional Comment:    _______________________________________________ Mearl LatinNadia S Wilma Wuthrich, LCSWA 03/01/2016, 3:41 PM

## 2016-09-23 DIAGNOSIS — K7290 Hepatic failure, unspecified without coma: Secondary | ICD-10-CM

## 2016-09-23 DIAGNOSIS — K7460 Unspecified cirrhosis of liver: Secondary | ICD-10-CM

## 2016-09-23 DIAGNOSIS — E441 Mild protein-calorie malnutrition: Secondary | ICD-10-CM

## 2016-09-23 DIAGNOSIS — E876 Hypokalemia: Secondary | ICD-10-CM

## 2016-09-23 DIAGNOSIS — Z8679 Personal history of other diseases of the circulatory system: Secondary | ICD-10-CM | POA: Diagnosis not present

## 2016-09-24 DIAGNOSIS — K7460 Unspecified cirrhosis of liver: Secondary | ICD-10-CM | POA: Diagnosis not present

## 2016-09-24 DIAGNOSIS — K7290 Hepatic failure, unspecified without coma: Secondary | ICD-10-CM | POA: Diagnosis not present

## 2016-09-24 DIAGNOSIS — E441 Mild protein-calorie malnutrition: Secondary | ICD-10-CM | POA: Diagnosis not present

## 2016-09-24 DIAGNOSIS — Z8679 Personal history of other diseases of the circulatory system: Secondary | ICD-10-CM | POA: Diagnosis not present

## 2016-09-25 DIAGNOSIS — K7460 Unspecified cirrhosis of liver: Secondary | ICD-10-CM | POA: Diagnosis not present

## 2016-09-25 DIAGNOSIS — E441 Mild protein-calorie malnutrition: Secondary | ICD-10-CM | POA: Diagnosis not present

## 2016-09-25 DIAGNOSIS — Z8679 Personal history of other diseases of the circulatory system: Secondary | ICD-10-CM | POA: Diagnosis not present

## 2016-09-25 DIAGNOSIS — K7290 Hepatic failure, unspecified without coma: Secondary | ICD-10-CM | POA: Diagnosis not present

## 2016-09-26 DIAGNOSIS — E441 Mild protein-calorie malnutrition: Secondary | ICD-10-CM | POA: Diagnosis not present

## 2016-09-26 DIAGNOSIS — K7460 Unspecified cirrhosis of liver: Secondary | ICD-10-CM | POA: Diagnosis not present

## 2016-09-26 DIAGNOSIS — Z8679 Personal history of other diseases of the circulatory system: Secondary | ICD-10-CM | POA: Diagnosis not present

## 2016-09-26 DIAGNOSIS — K7290 Hepatic failure, unspecified without coma: Secondary | ICD-10-CM | POA: Diagnosis not present

## 2016-09-27 DIAGNOSIS — Z8679 Personal history of other diseases of the circulatory system: Secondary | ICD-10-CM | POA: Diagnosis not present

## 2016-09-27 DIAGNOSIS — E441 Mild protein-calorie malnutrition: Secondary | ICD-10-CM | POA: Diagnosis not present

## 2016-09-27 DIAGNOSIS — K7460 Unspecified cirrhosis of liver: Secondary | ICD-10-CM | POA: Diagnosis not present

## 2016-09-27 DIAGNOSIS — K7290 Hepatic failure, unspecified without coma: Secondary | ICD-10-CM | POA: Diagnosis not present

## 2016-09-28 DIAGNOSIS — Z8679 Personal history of other diseases of the circulatory system: Secondary | ICD-10-CM | POA: Diagnosis not present

## 2016-09-28 DIAGNOSIS — E441 Mild protein-calorie malnutrition: Secondary | ICD-10-CM | POA: Diagnosis not present

## 2016-09-28 DIAGNOSIS — K7460 Unspecified cirrhosis of liver: Secondary | ICD-10-CM | POA: Diagnosis not present

## 2016-09-28 DIAGNOSIS — K7290 Hepatic failure, unspecified without coma: Secondary | ICD-10-CM | POA: Diagnosis not present

## 2017-08-15 IMAGING — US US ABDOMEN COMPLETE W/ ELASTOGRAPHY
1 series · 13 of 25 positions shown · non-contrast
Comparison: None.

CLINICAL DATA: Hepatitis-C without coma.  Chronic



[Series 1: us abdomen complete w/ elastography · 0.25mm/px · 13 of 68 slices shown]
[im 1/68]
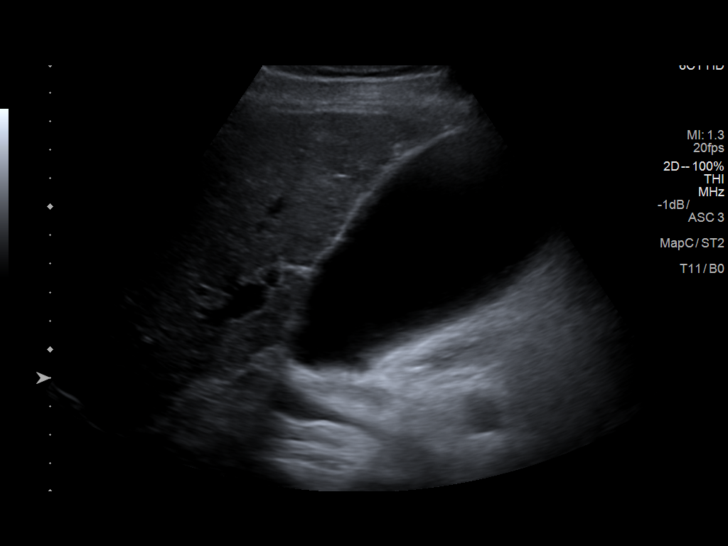
[im 6/68]
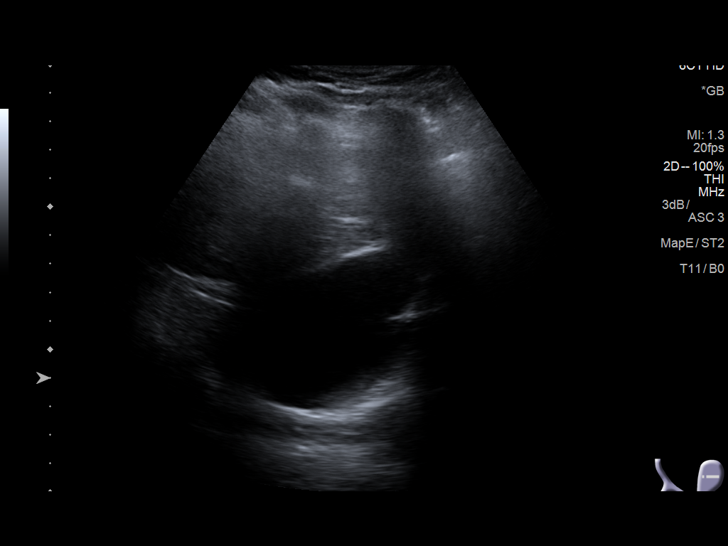
[im 12/68]
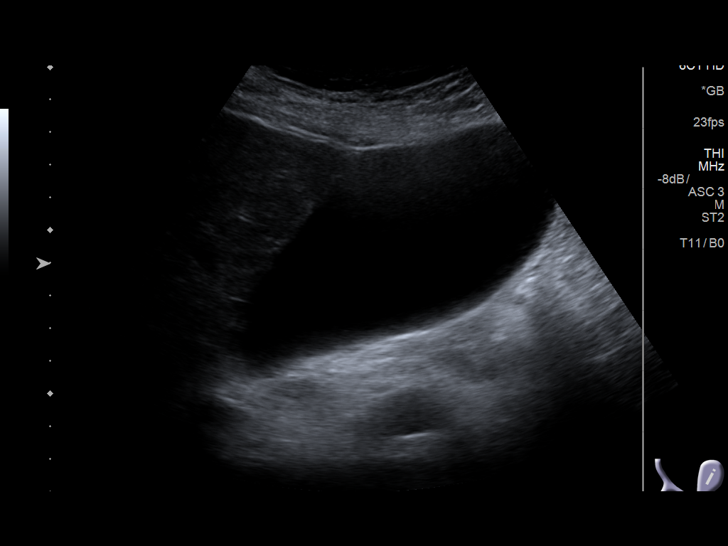
[im 17/68]
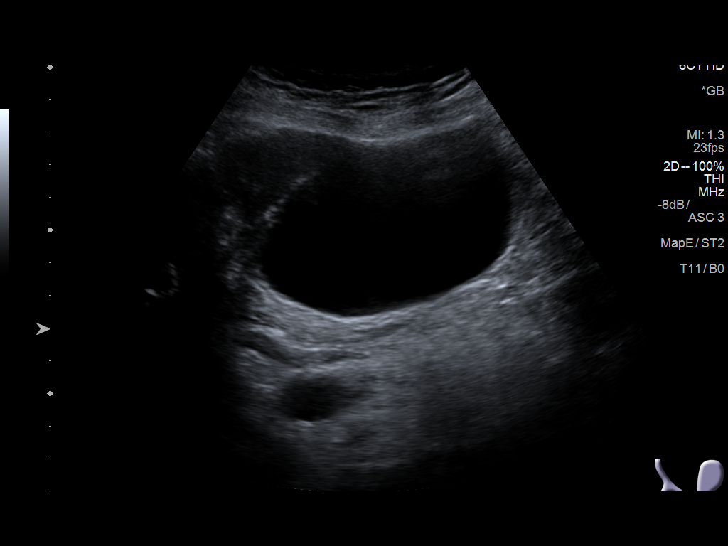
[im 23/68]
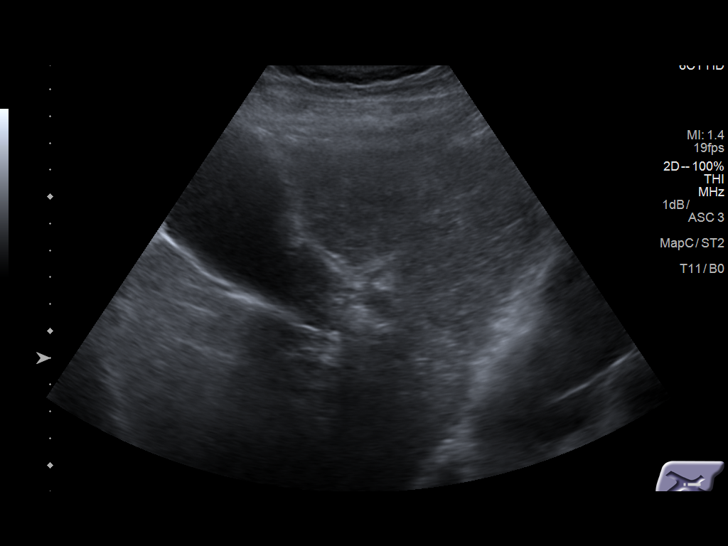
[im 28/68]
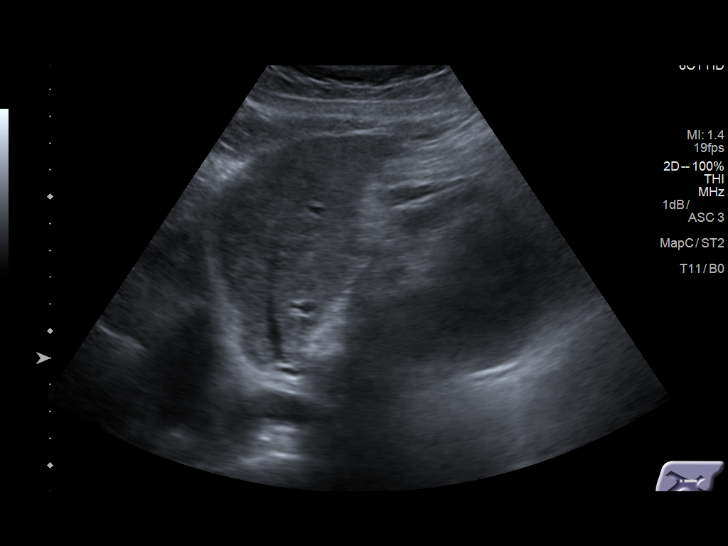
[im 34/68]
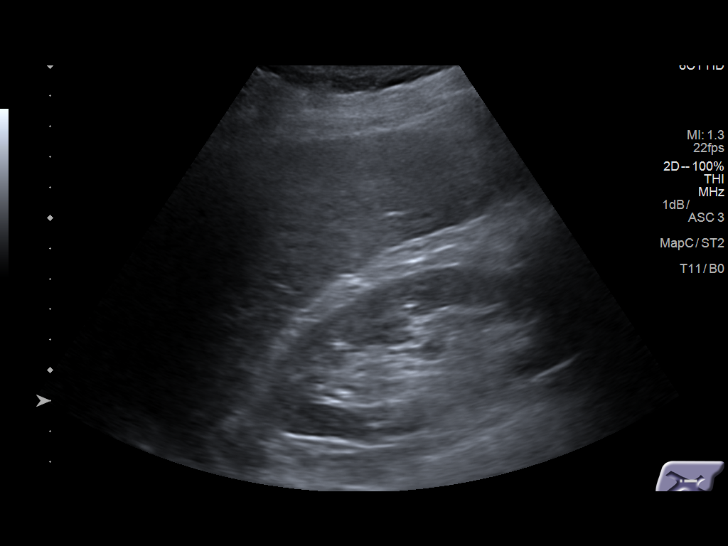
[im 40/68]
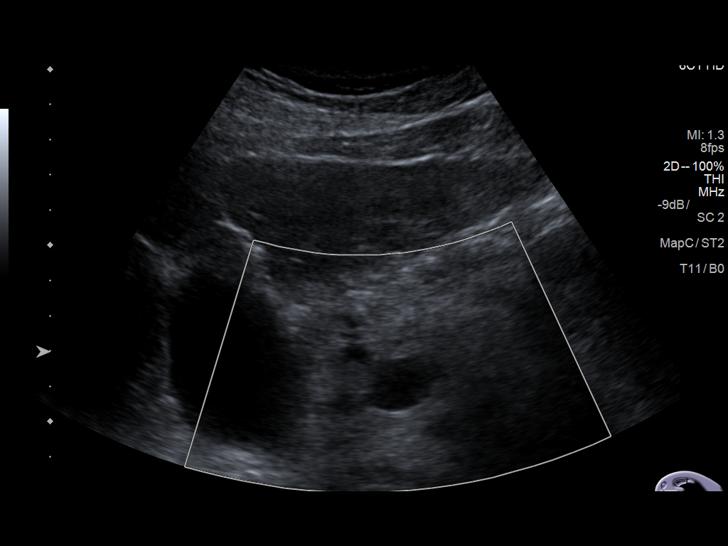
[im 45/68]
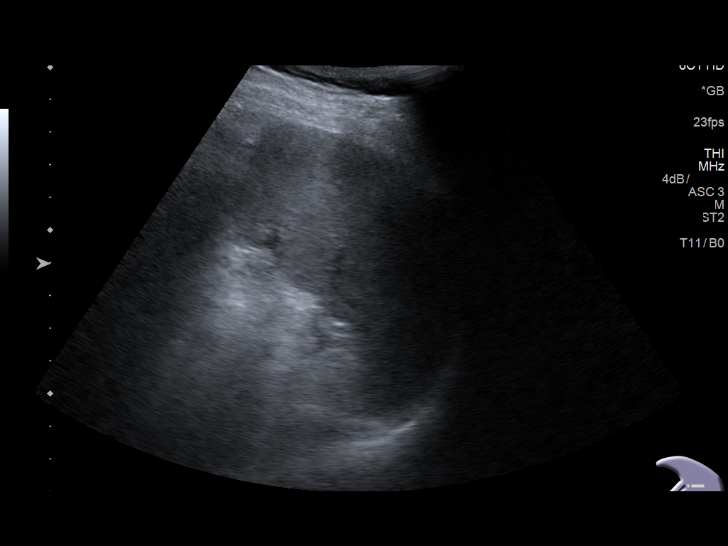
[im 51/68]
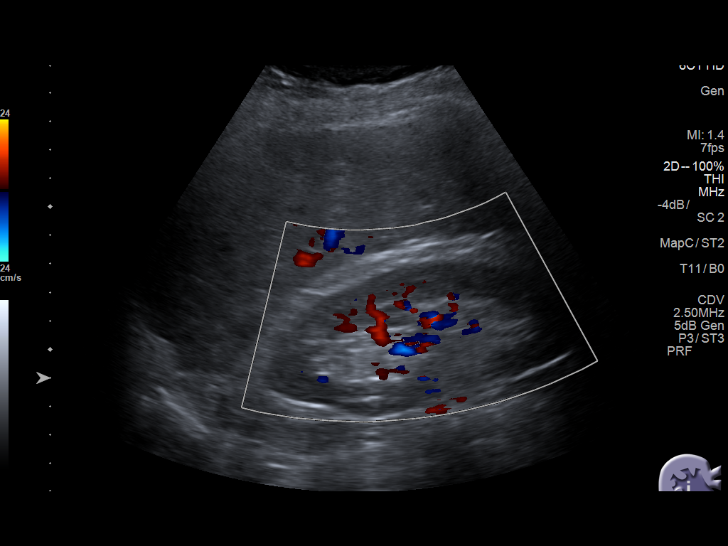
[im 56/68]
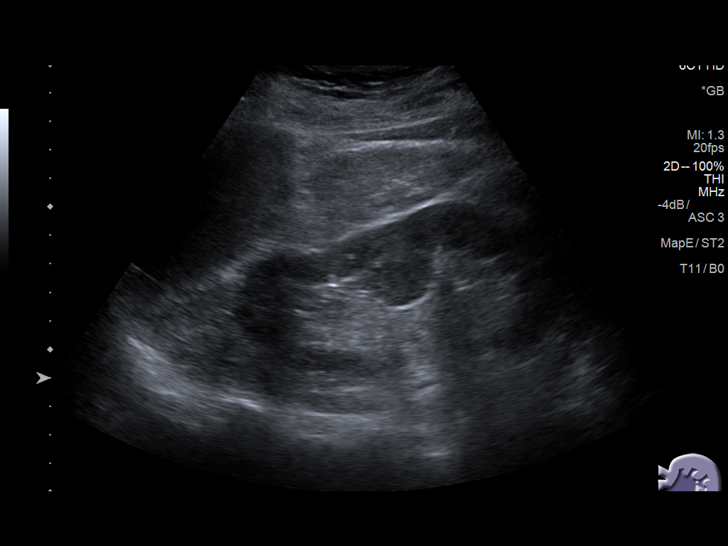
[im 62/68]
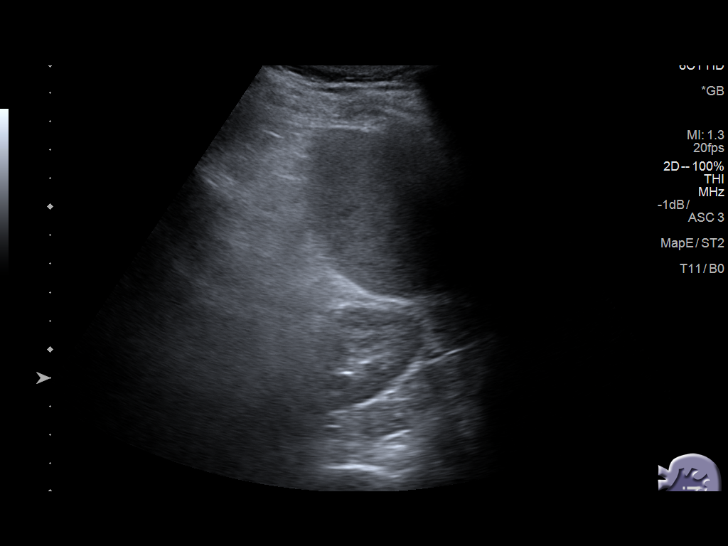
[im 68/68]
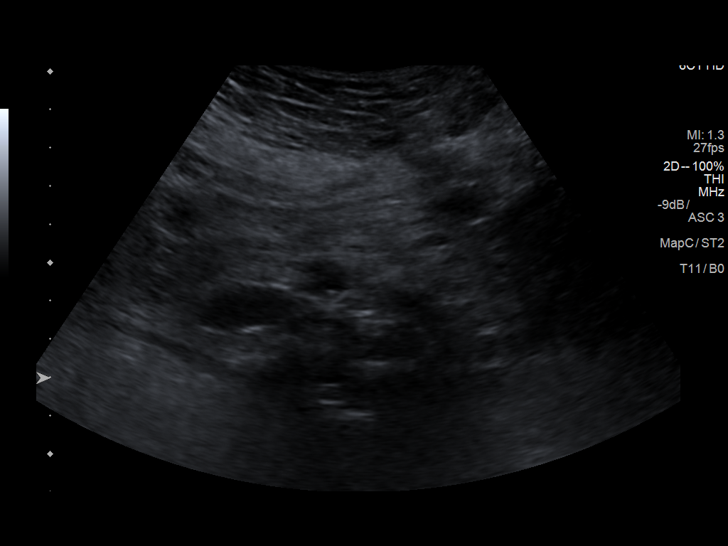

[13 of 25 positions shown; findings below may reference images not displayed]

FINDINGS: ULTRASOUND ABDOMEN

Gallbladder: No gallstones or wall thickening visualized. No
sonographic Murphy sign noted.

Common bile duct: Diameter: 0.63 mm

Liver: The liver has a coarsened echotexture. The contour the liver
is irregular.

IVC: No abnormality visualized.

Pancreas: Visualized portion unremarkable.

Spleen: Size and appearance within normal limits.

Right Kidney: Length: 12.0 cm. Echogenicity within normal limits. No
mass or hydronephrosis visualized.

Left Kidney: Length: 13.4 cm. Echogenicity within normal limits. No
mass or hydronephrosis visualized.

Abdominal aorta: 2.5 cm

Other findings: None.

ULTRASOUND HEPATIC ELASTOGRAPHY

Device: Siemens Helix VTQ

Transducer 6 C1 HD

Patient position: Supine

Number of measurements:  10

Hepatic Segment:  8

Median velocity:   4.19  m/sec

IQR:

IQR/Median velocity ratio

Corresponding Metavir fibrosis score:  F 3/F 4

Risk of fibrosis: High

Limitations of exam: None

Pertinent findings noted on other imaging exams: Coarsened
echotexture throughout the liver.

Please note that abnormal shear wave velocities may also be
identified in clinical settings other than with hepatic fibrosis,
such as: acute hepatitis, elevated right heart and central venous
pressures including use of beta blockers, Ramon Hernan disease
([place]), infiltrative processes such as
mastocytosis/amyloidosis/infiltrative tumor, extrahepatic
cholestasis, in the post-prandial state, and liver transplantation.
Correlation with patient history, laboratory data, and clinical
condition recommended.
IMPRESSION: 1. Liver has a coarsened echotexture. The contour the liver is also
slightly irregular. Findings may be a manifestation of early
cirrhosis.

Median hepatic shear wave velocity is calculated at 4.19 m/sec.

Corresponding Metavir fibrosis score is F 3 and F 4.

Risk of fibrosis is high.

Follow-up:  Advised

## 2017-08-15 IMAGING — US US ABDOMEN COMPLETE W/ ELASTOGRAPHY
1 series · 13 of 21 positions shown · non-contrast
Comparison: None.

CLINICAL DATA: Hepatitis-C without coma.  Chronic



[Series 1: us abdomen complete w/ elastography · 0.19mm/px · 13 of 21 slices shown]
[im 1/21]
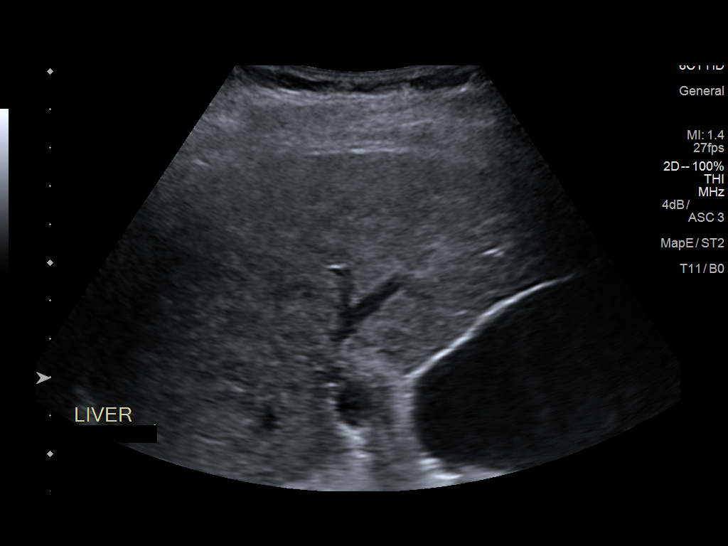
[im 3/21]
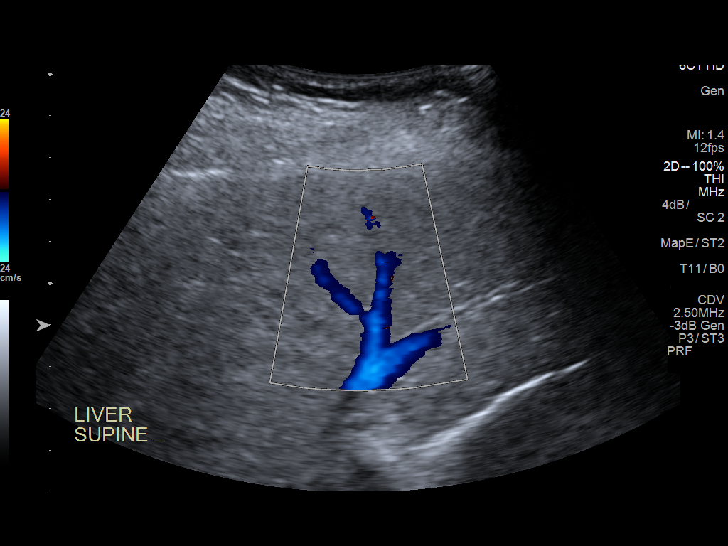
[im 5/21]
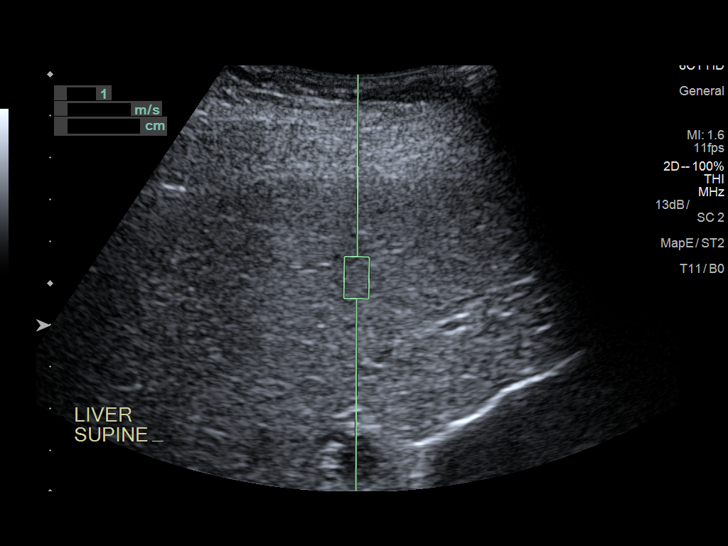
[im 6/21]
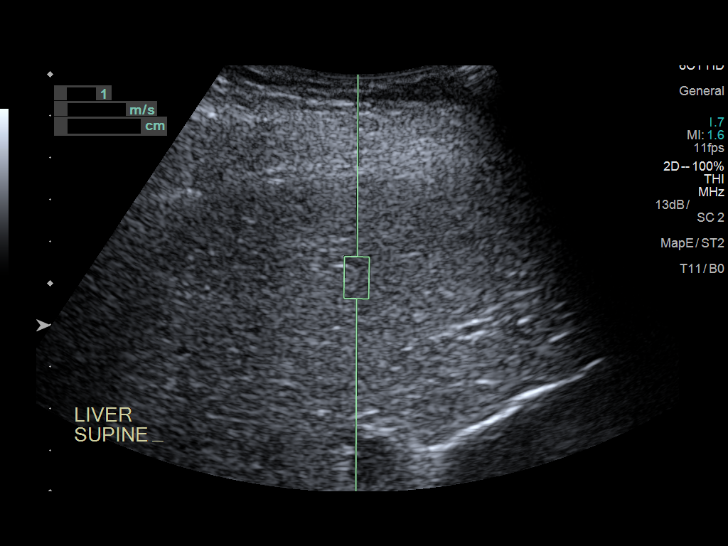
[im 8/21]
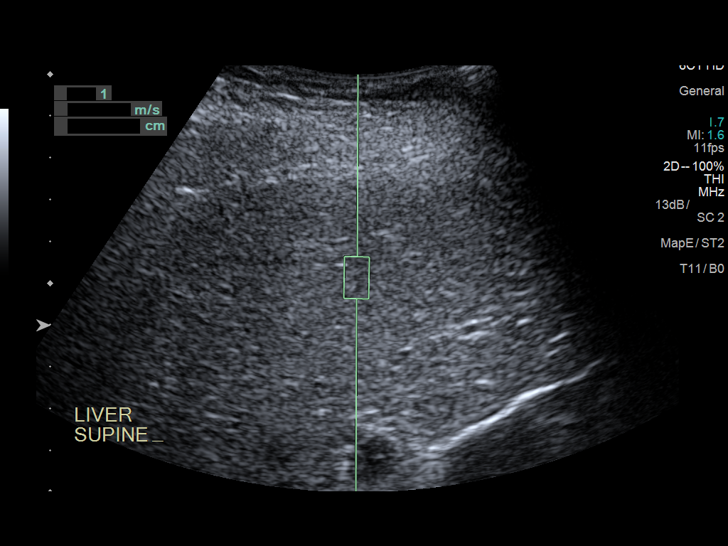
[im 9/21]
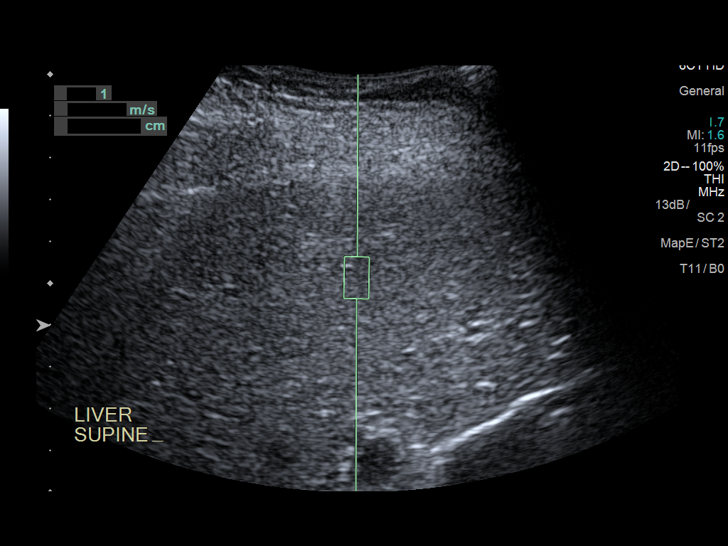
[im 11/21]
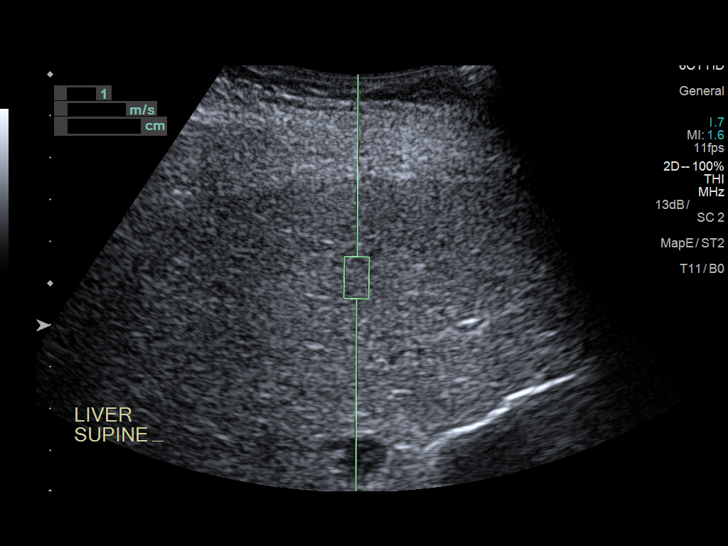
[im 13/21]
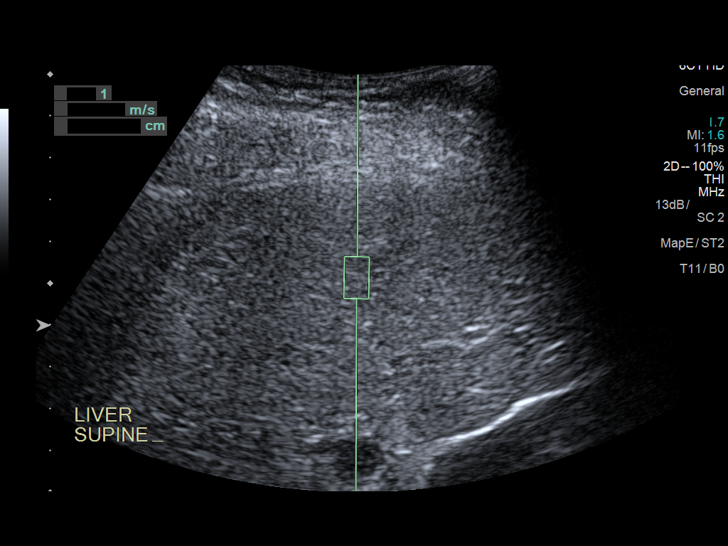
[im 14/21]
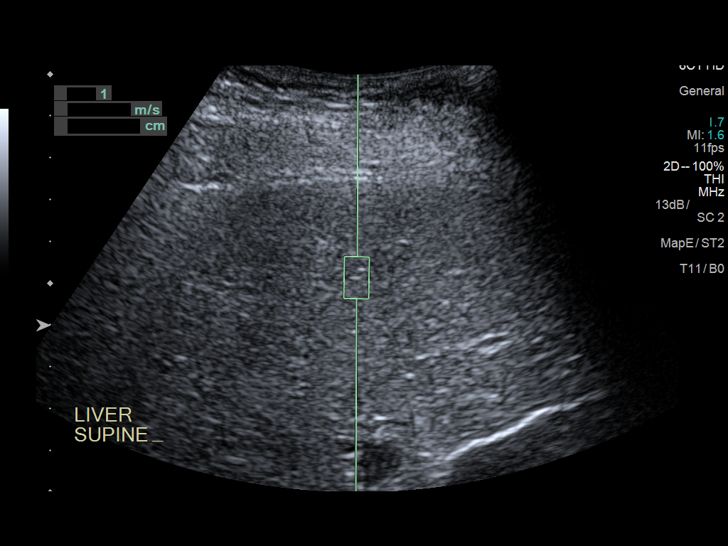
[im 16/21]
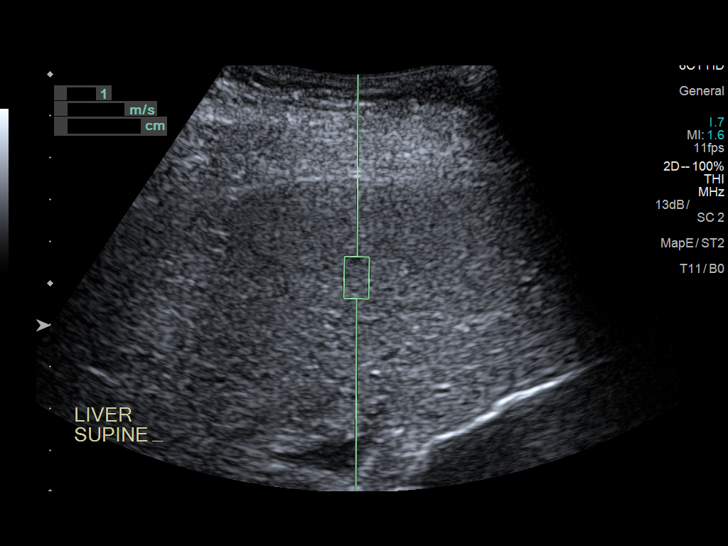
[im 17/21]
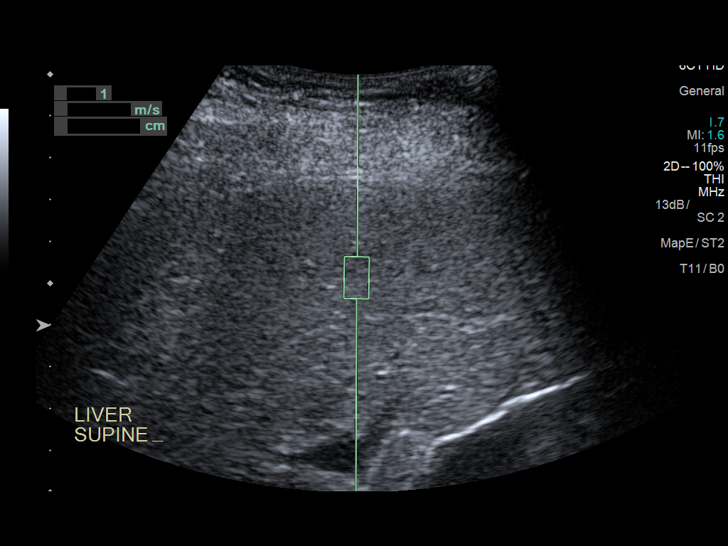
[im 19/21]
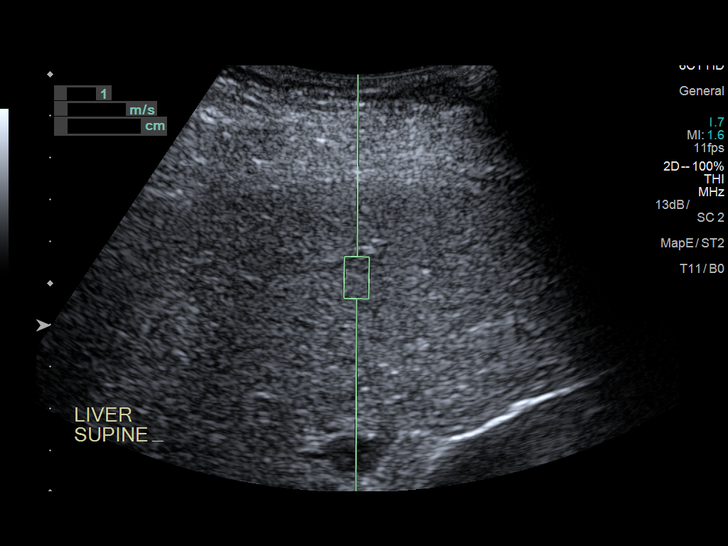
[im 21/21]
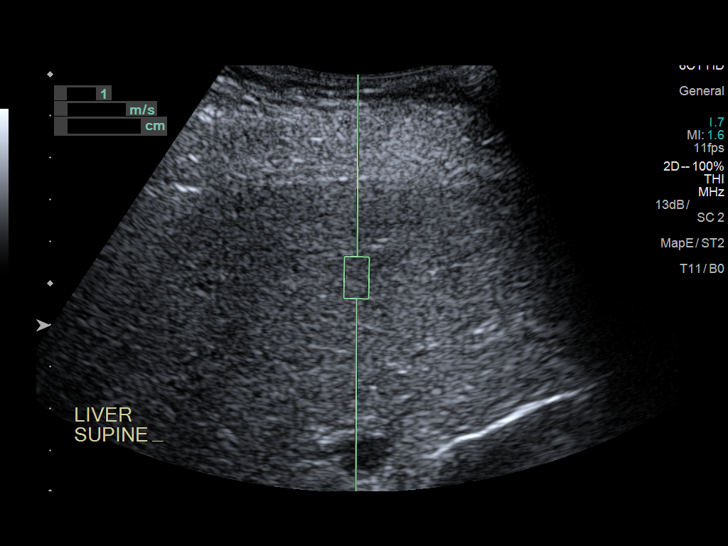

[13 of 21 positions shown; findings below may reference images not displayed]

FINDINGS: ULTRASOUND ABDOMEN

Gallbladder: No gallstones or wall thickening visualized. No
sonographic Murphy sign noted.

Common bile duct: Diameter: 0.63 mm

Liver: The liver has a coarsened echotexture. The contour the liver
is irregular.

IVC: No abnormality visualized.

Pancreas: Visualized portion unremarkable.

Spleen: Size and appearance within normal limits.

Right Kidney: Length: 12.0 cm. Echogenicity within normal limits. No
mass or hydronephrosis visualized.

Left Kidney: Length: 13.4 cm. Echogenicity within normal limits. No
mass or hydronephrosis visualized.

Abdominal aorta: 2.5 cm

Other findings: None.

ULTRASOUND HEPATIC ELASTOGRAPHY

Device: Siemens Helix VTQ

Transducer 6 C1 HD

Patient position: Supine

Number of measurements:  10

Hepatic Segment:  8

Median velocity:   4.19  m/sec

IQR:

IQR/Median velocity ratio

Corresponding Metavir fibrosis score:  F 3/F 4

Risk of fibrosis: High

Limitations of exam: None

Pertinent findings noted on other imaging exams: Coarsened
echotexture throughout the liver.

Please note that abnormal shear wave velocities may also be
identified in clinical settings other than with hepatic fibrosis,
such as: acute hepatitis, elevated right heart and central venous
pressures including use of beta blockers, Ramon Hernan disease
([place]ak[place]), infiltrative processes such as
mastocytosis/amyloidosis/infiltrative tumor, extrahepatic
cholestasis, in the post-prandial state, and liver transplantation.
Correlation with patient history, laboratory data, and clinical
condition recommended.
IMPRESSION: 1. Liver has a coarsened echotexture. The contour the liver is also
slightly irregular. Findings may be a manifestation of early
cirrhosis.

Median hepatic shear wave velocity is calculated at 4.19 m/sec.

Corresponding Metavir fibrosis score is F 3 and F 4.

Risk of fibrosis is high.

Follow-up:  Advised

## 2017-08-20 IMAGING — CR DG LUMBAR SPINE 2-3V
1 series · 1 of 1 positions shown · non-contrast
Comparison: 01/17/2016

CLINICAL DATA: L4-5 and L5-S1 PLIF

EXAM:
LUMBAR SPINE - 2-3 VIEW

[lat]
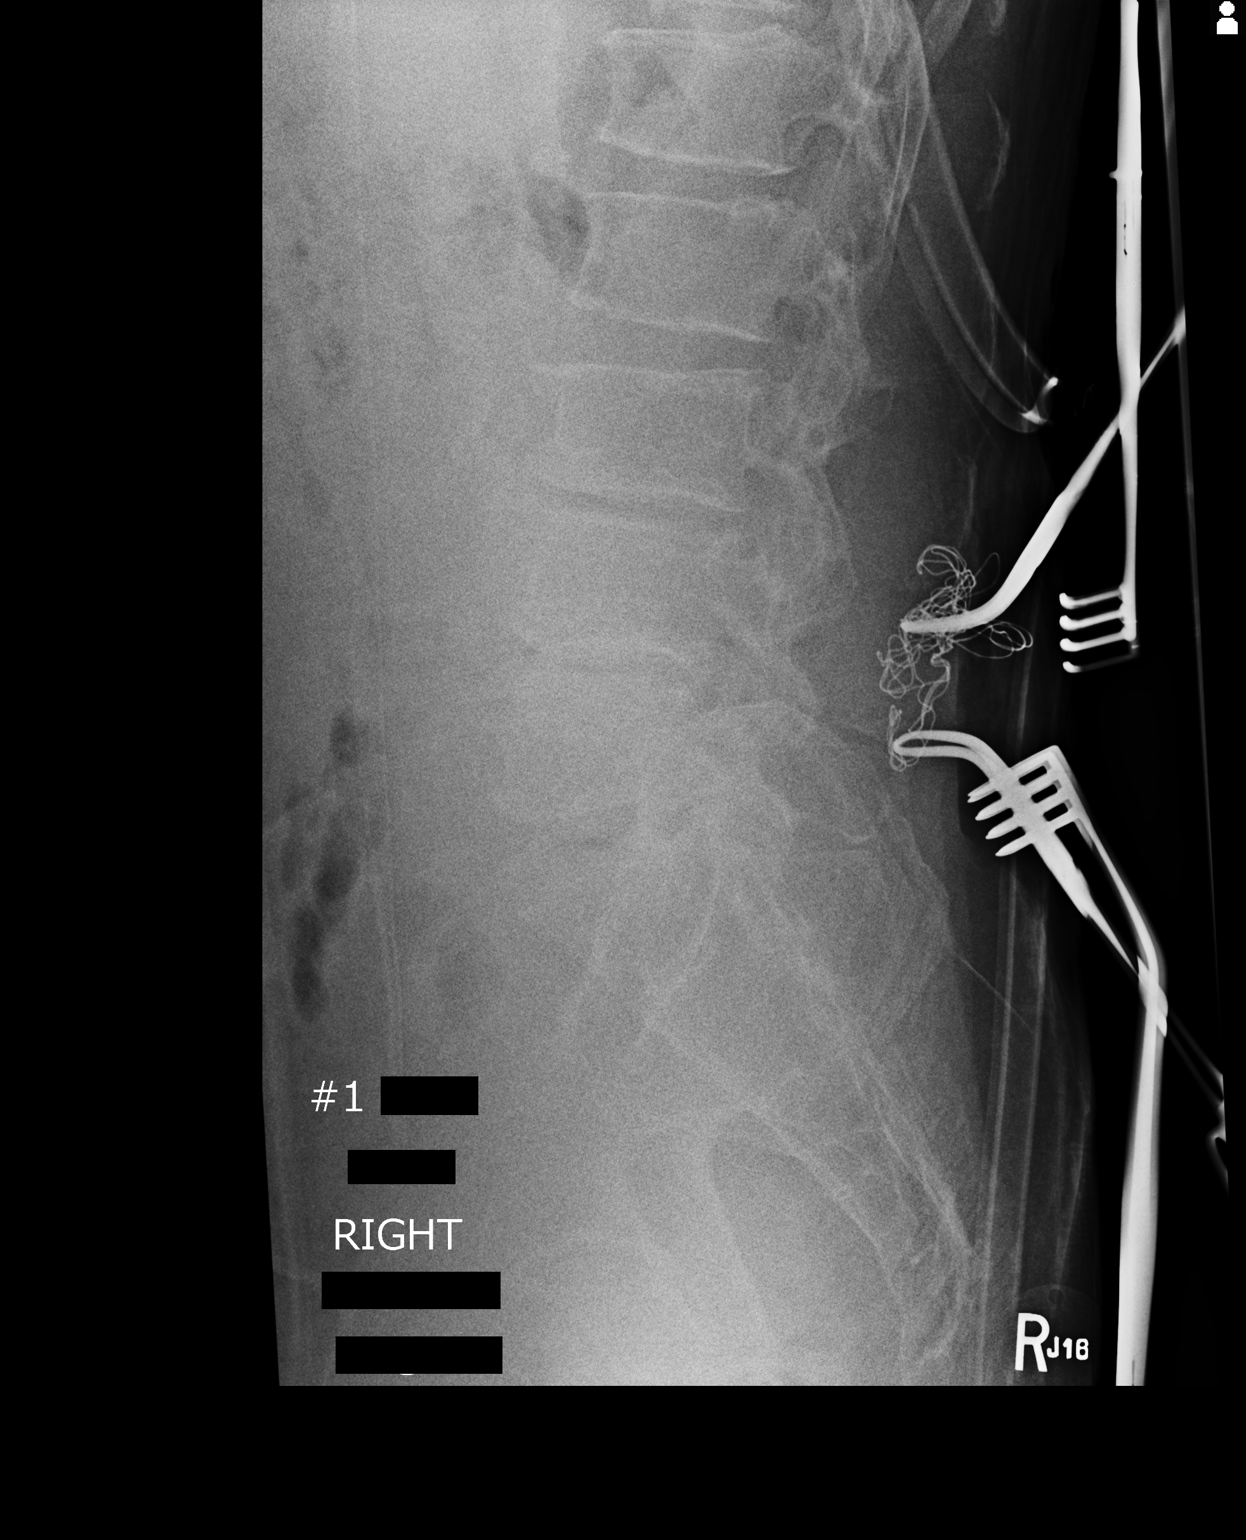

[1 of 1 positions shown; findings below may reference images not displayed]

FINDINGS: Image 1 shows 2 surgical device is 1 projecting above and 1 below
the L4 spinous process. Second image shows 2 localization probes, 1
projecting at the L4-5 disc space level and the other at the L5-S1
disc space level.
IMPRESSION: Intraoperative localization

## 2018-02-27 ENCOUNTER — Telehealth: Payer: Self-pay

## 2018-02-27 NOTE — Telephone Encounter (Signed)
Please continue coreg (same dose) twice a day with 12 refills

## 2018-02-27 NOTE — Telephone Encounter (Signed)
Patient would like a refill on his Carvedilol 3.125mg  BID. [facility] [address]. Would you like to give any refills?

## 2018-02-28 MED ORDER — CARVEDILOL 3.125 MG PO TABS: 1.5625 mg | ORAL_TABLET | Freq: Two times a day (BID) | ORAL | 11 refills | Status: DC

## 2018-02-28 NOTE — Telephone Encounter (Signed)
Sent refills to patients pharmacy.  

## 2018-09-13 ENCOUNTER — Ambulatory Visit: Payer: Medicare HMO | Admitting: Sports Medicine

## 2018-09-13 ENCOUNTER — Encounter: Payer: Self-pay | Admitting: Sports Medicine

## 2018-09-13 ENCOUNTER — Other Ambulatory Visit: Payer: Self-pay

## 2018-09-13 VITALS — BP 130/80 | HR 86 | Resp 16 | Ht 71.0 in | Wt 200.0 lb

## 2018-09-13 DIAGNOSIS — M79671 Pain in right foot: Secondary | ICD-10-CM | POA: Diagnosis not present

## 2018-09-13 DIAGNOSIS — S92301A Fracture of unspecified metatarsal bone(s), right foot, initial encounter for closed fracture: Secondary | ICD-10-CM | POA: Diagnosis not present

## 2018-09-13 NOTE — Progress Notes (Addendum)
Subjective: Kenneth Kenneth Obrien C Kenneth Obrien is a 67 y.o. male patient who presents to office for evaluation of Right foot pain. Patient complains of pain to the right foot after a fall about 1-1/2 weeks ago states that his foot bit back and since then his swelling has gotten better and has had his leg in a soft splint as put [facility].  States that his pain in his foot is 3-4 out of 10 sharp worse at night with some swelling dates that he has been using his walker and has been icing.  Patient was found in the room looking at the office supplies and sprained his foot with wound cleanser when I walked in when I asked the patient what he was doing he was saying that he was cleaning off his foot.  Patient question my ability to care for him the entire time during treatment.  Review of Systems  Cardiovascular: Positive for leg swelling.  Musculoskeletal: Positive for joint pain and myalgias.  All other systems reviewed and are negative.    Patient Active Problem List   Diagnosis Date Noted  . Back pain   . Essential hypertension   . Chronic obstructive pulmonary disease (HCC)   . OSA (obstructive sleep apnea)   . Cirrhosis of liver without ascites (HCC)   . Chronic hepatitis C without hepatic coma (HCC)   . Peripheral neuropathy   . Respiratory depression   . Leukocytosis   . Thrombocytopenia (HCC)   . Acute blood loss anemia   . Back injuries 02/23/2016  . 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 Osteoarthritis of spine with radiculopathy, lumbar region 04/17/2015  . Spondylolysis, lumbar region 03/05/2015    Current Outpatient Medications on File Prior to Visit  Medication Sig Dispense Refill  . carvedilol (COREG) 3.125 MG tablet Take 3.125 mg by mouth 2 (two) times daily with a meal.    . furosemide (LASIX) 20 MG tablet Take 20 mg by mouth.    . oxyCODONE (ROXICODONE) 15 MG immediate release tablet Take 1 tablet (15 mg total) by mouth 3 (three)  times daily as needed for moderate pain. 90 tablet 0  . Oxycodone HCl 20 MG TABS Take 20 mg by mouth 3 (three) times daily as needed (for pain).     Marland Kitchen. spironolactone (ALDACTONE) 25 MG tablet Take 25 mg by mouth daily.     No current facility-administered medications on file prior to visit.     No Known Allergies  Objective:  General: Alert and  in no acute distress however was very resistant to my opinion on the status of his foot and course of treatment today  Dermatology: Severe plantar maceration right foot with evidence that his posterior splint had been wet however patient does not admit to a history and says that he thinks he may have gotten it wet, bruising noted to the right foot, no open lesions bilateral lower extremities, no acute symptoms to his toenails.  Vascular: Focal edema noted to right at areas of fracture.  Dorsalis Pedis and Posterior Tibial pedal pulses difficult to palpate due to swelling and limited exam because patient keeps moving.  Neurology: Sensation present via light touch however this is still difficult to discern because patient keeps moving.  Musculoskeletal: Subjective tenderness to right foot at areas of fractures however unable to fully assess due to patient compliance and he is moving during the process of me examining him and is questioning my opinion on what  is going on with his foot.  Gait: Walker assisted, Antalgic gait all    Assessment and Plan: Problem List Items Addressed This Visit    None    Visit Diagnoses    Multiple closed fractures of metatarsal bone of right foot, initial encounter    -  Primary   Right foot pain       Relevant Orders   DG Foot Complete Right       -Complete examination performed -Xrays reviewed from [facility] which reveals all metatarsals 1 through 5 fractures with minimal displacement -Discussed treatement options for fracture; risks, alternatives, and benefits explained however patient is very  resistant to my treatment today and verbally insulted my ability and said that I did not know what I was doing thus I politely took off my gloves for and told the patient what I thought was best for him treatment wise as below and gave the him the personal option to follow-up with me in the future to continue to evaluate these fractures or to find another provider -Recommend Betadine to address the maceration that is over the entire plantar surface of his foot -Recommend use cam walker and rolling walker -Recommend protection, rest, ice, elevation daily until symptoms improve -Recommend surgitube compression sleeve for edema control -Patient to return to office in 4 weeks for serial x-rays to assess healing  or sooner if condition worsens if he desires to return.  Asencion Islam, DPM

## 2018-09-13 NOTE — Progress Notes (Signed)
   Subjective:    Patient ID: Kenneth Obrien, male    DOB: 22-Nov-1950, 67 y.o.   MRN: [id number]  HPI    Review of Systems  Musculoskeletal: Positive for arthralgias, joint swelling and myalgias.  All other systems reviewed and are negative.      Objective:   Physical Exam        Assessment & Plan:

## 2018-10-11 ENCOUNTER — Ambulatory Visit: Payer: Medicare HMO | Admitting: Sports Medicine

## 2020-04-24 DEATH — deceased
